# Patient Record
Sex: Male | Born: 1990 | Race: Black or African American | Hispanic: No | Marital: Single | Smoking: Never smoker
Health system: Southern US, Community
[De-identification: ages and names within clinical notes are randomized; demographics above are authoritative.]

## PROBLEM LIST (undated history)

## (undated) DIAGNOSIS — B20 Human immunodeficiency virus [HIV] disease: Secondary | ICD-10-CM

## (undated) DIAGNOSIS — Z21 Asymptomatic human immunodeficiency virus [HIV] infection status: Secondary | ICD-10-CM

## (undated) DIAGNOSIS — A546 Gonococcal infection of anus and rectum: Secondary | ICD-10-CM

## (undated) DIAGNOSIS — K5190 Ulcerative colitis, unspecified, without complications: Secondary | ICD-10-CM

## (undated) DIAGNOSIS — A630 Anogenital (venereal) warts: Secondary | ICD-10-CM

## (undated) HISTORY — PX: OTHER SURGICAL HISTORY: SHX169

---

## 2009-08-18 ENCOUNTER — Emergency Department (HOSPITAL_COMMUNITY): Admission: EM | Admit: 2009-08-18 | Discharge: 2009-08-19 | Payer: Self-pay | Admitting: Emergency Medicine

## 2010-04-21 LAB — URINALYSIS, ROUTINE W REFLEX MICROSCOPIC
Bilirubin Urine: NEGATIVE
Ketones, ur: NEGATIVE mg/dL
Nitrite: NEGATIVE
Specific Gravity, Urine: 1.021 (ref 1.005–1.030)
Urobilinogen, UA: 1 mg/dL (ref 0.0–1.0)

## 2010-04-21 LAB — HEMOCCULT GUIAC POC 1CARD (OFFICE): Fecal Occult Bld: POSITIVE

## 2013-11-02 ENCOUNTER — Encounter (HOSPITAL_BASED_OUTPATIENT_CLINIC_OR_DEPARTMENT_OTHER): Payer: Self-pay | Admitting: Emergency Medicine

## 2013-11-02 ENCOUNTER — Inpatient Hospital Stay (HOSPITAL_BASED_OUTPATIENT_CLINIC_OR_DEPARTMENT_OTHER)
Admission: EM | Admit: 2013-11-02 | Discharge: 2013-11-05 | DRG: 987 | Disposition: A | Discharge status: Home / Self Care | Payer: Self-pay | Attending: Internal Medicine | Admitting: Internal Medicine

## 2013-11-02 DIAGNOSIS — Z79899 Other long term (current) drug therapy: Secondary | ICD-10-CM

## 2013-11-02 DIAGNOSIS — R339 Retention of urine, unspecified: Secondary | ICD-10-CM

## 2013-11-02 DIAGNOSIS — B20 Human immunodeficiency virus [HIV] disease: Secondary | ICD-10-CM

## 2013-11-02 DIAGNOSIS — B370 Candidal stomatitis: Secondary | ICD-10-CM | POA: Diagnosis present

## 2013-11-02 DIAGNOSIS — K611 Rectal abscess: Principal | ICD-10-CM | POA: Diagnosis present

## 2013-11-02 HISTORY — DX: Human immunodeficiency virus (HIV) disease: B20

## 2013-11-02 HISTORY — DX: Gonococcal infection of anus and rectum: A54.6

## 2013-11-02 HISTORY — DX: Asymptomatic human immunodeficiency virus (hiv) infection status: Z21

## 2013-11-02 HISTORY — DX: Anogenital (venereal) warts: A63.0

## 2013-11-02 NOTE — ED Notes (Signed)
Pt c/o back pain was seen at [facility] today for same and unable to urinate since Sunday  Had foley placed  Did not receive any meds except muscle relaxant  Which is not controlling the pain

## 2013-11-02 NOTE — ED Notes (Signed)
Pt now states he was also told he had constipation

## 2013-11-02 NOTE — ED Notes (Signed)
Back pain x 3 days.  He was [facility] earlier today for back pain and unable to urinate. They inserted an indwelling foley and discharged him with a leg bag and told him he was constipated. They would not give him narcotics only muscle relaxer's and patient states they do not help his pain.

## 2013-11-03 ENCOUNTER — Encounter (HOSPITAL_COMMUNITY): Payer: Self-pay | Admitting: Anesthesiology

## 2013-11-03 ENCOUNTER — Encounter (HOSPITAL_BASED_OUTPATIENT_CLINIC_OR_DEPARTMENT_OTHER): Payer: Self-pay | Admitting: Emergency Medicine

## 2013-11-03 ENCOUNTER — Emergency Department (HOSPITAL_BASED_OUTPATIENT_CLINIC_OR_DEPARTMENT_OTHER): Payer: Self-pay

## 2013-11-03 ENCOUNTER — Encounter (HOSPITAL_COMMUNITY)
Admission: EM | Disposition: A | Discharge status: Home / Self Care | Payer: Self-pay | Source: Home / Self Care | Attending: Internal Medicine

## 2013-11-03 ENCOUNTER — Inpatient Hospital Stay (HOSPITAL_COMMUNITY): Payer: Self-pay | Admitting: Anesthesiology

## 2013-11-03 DIAGNOSIS — R339 Retention of urine, unspecified: Secondary | ICD-10-CM

## 2013-11-03 DIAGNOSIS — B20 Human immunodeficiency virus [HIV] disease: Secondary | ICD-10-CM

## 2013-11-03 DIAGNOSIS — K611 Rectal abscess: Secondary | ICD-10-CM | POA: Diagnosis present

## 2013-11-03 DIAGNOSIS — K612 Anorectal abscess: Secondary | ICD-10-CM

## 2013-11-03 HISTORY — PX: INCISION AND DRAINAGE PERIRECTAL ABSCESS: SHX1804

## 2013-11-03 LAB — GRAM STAIN

## 2013-11-03 LAB — CBC WITH DIFFERENTIAL/PLATELET
Basophils Absolute: 0 10*3/uL (ref 0.0–0.1)
Basophils Relative: 0 % (ref 0–1)
EOS PCT: 0 % (ref 0–5)
Eosinophils Absolute: 0 10*3/uL (ref 0.0–0.7)
HCT: 42.5 % (ref 39.0–52.0)
HEMOGLOBIN: 13.9 g/dL (ref 13.0–17.0)
LYMPHS ABS: 0.9 10*3/uL (ref 0.7–4.0)
LYMPHS PCT: 10 % — AB (ref 12–46)
MCH: 30 pg (ref 26.0–34.0)
MCHC: 32.7 g/dL (ref 30.0–36.0)
MCV: 91.8 fL (ref 78.0–100.0)
MONO ABS: 0.7 10*3/uL (ref 0.1–1.0)
MONOS PCT: 8 % (ref 3–12)
Neutro Abs: 7.4 10*3/uL (ref 1.7–7.7)
Neutrophils Relative %: 82 % — ABNORMAL HIGH (ref 43–77)
Platelets: 198 10*3/uL (ref 150–400)
RBC: 4.63 MIL/uL (ref 4.22–5.81)
RDW: 14.3 % (ref 11.5–15.5)
WBC: 9 10*3/uL (ref 4.0–10.5)

## 2013-11-03 LAB — CREATININE, SERUM
CREATININE: 0.82 mg/dL (ref 0.50–1.35)
GFR calc Af Amer: >90 mL/min (ref >90)
GFR calc non Af Amer: >90 mL/min (ref >90)

## 2013-11-03 LAB — URINALYSIS, ROUTINE W REFLEX MICROSCOPIC
BILIRUBIN URINE: NEGATIVE
GLUCOSE, UA: NEGATIVE mg/dL
HGB URINE DIPSTICK: NEGATIVE
KETONES UR: NEGATIVE mg/dL
Nitrite: NEGATIVE
PH: 6.5 (ref 5.0–8.0)
Protein, ur: NEGATIVE mg/dL
Specific Gravity, Urine: 1.022 (ref 1.005–1.030)
Urobilinogen, UA: 0.2 mg/dL (ref 0.0–1.0)

## 2013-11-03 LAB — SURGICAL PCR SCREEN
MRSA, PCR: NEGATIVE
STAPHYLOCOCCUS AUREUS: NEGATIVE

## 2013-11-03 LAB — BASIC METABOLIC PANEL
Anion gap: 12 (ref 5–15)
BUN: 9 mg/dL (ref 6–23)
CALCIUM: 9.6 mg/dL (ref 8.4–10.5)
CO2: 26 meq/L (ref 19–32)
CREATININE: 0.9 mg/dL (ref 0.50–1.35)
Chloride: 100 mEq/L (ref 96–112)
GFR calc Af Amer: >90 mL/min (ref >90)
GFR calc non Af Amer: >90 mL/min (ref >90)
GLUCOSE: 122 mg/dL — AB (ref 70–99)
Potassium: 4.5 mEq/L (ref 3.7–5.3)
Sodium: 138 mEq/L (ref 137–147)

## 2013-11-03 LAB — CBC
HCT: 42.7 % (ref 39.0–52.0)
HEMOGLOBIN: 13.8 g/dL (ref 13.0–17.0)
MCH: 30.2 pg (ref 26.0–34.0)
MCHC: 32.3 g/dL (ref 30.0–36.0)
MCV: 93.4 fL (ref 78.0–100.0)
Platelets: 180 10*3/uL (ref 150–400)
RBC: 4.57 MIL/uL (ref 4.22–5.81)
RDW: 15 % (ref 11.5–15.5)
WBC: 11.3 10*3/uL — ABNORMAL HIGH (ref 4.0–10.5)

## 2013-11-03 LAB — URINE MICROSCOPIC-ADD ON

## 2013-11-03 LAB — T-HELPER CELLS (CD4) COUNT (NOT AT ARMC)
CD4 % Helper T Cell: 24 % — ABNORMAL LOW (ref 33–55)
CD4 T CELL ABS: 200 /uL — AB (ref 400–2700)

## 2013-11-03 SURGERY — INCISION AND DRAINAGE, ABSCESS, PERIRECTAL
Anesthesia: General | Site: Rectum

## 2013-11-03 MED ORDER — HEPARIN SODIUM (PORCINE) 5000 UNIT/ML IJ SOLN
5000.0000 [IU] | Freq: Three times a day (TID) | INTRAMUSCULAR | Status: DC
  Filled 2013-11-03 (×2): qty 1

## 2013-11-03 MED ORDER — RITONAVIR 100 MG PO TABS
100.0000 mg | ORAL_TABLET | Freq: Every day | ORAL | Status: DC
  Administered 2013-11-03 – 2013-11-05 (×3): 100 mg via ORAL
  Filled 2013-11-03 (×4): qty 1

## 2013-11-03 MED ORDER — TENOFOVIR DISOPROXIL FUMARATE 300 MG PO TABS
300.0000 mg | ORAL_TABLET | Freq: Every day | ORAL | Status: DC
  Filled 2013-11-03: qty 1

## 2013-11-03 MED ORDER — PROPOFOL 10 MG/ML IV BOLUS
INTRAVENOUS | Status: AC
  Filled 2013-11-03: qty 20

## 2013-11-03 MED ORDER — FENTANYL CITRATE 0.05 MG/ML IJ SOLN: 25.0000 ug | INTRAMUSCULAR | Status: DC | PRN

## 2013-11-03 MED ORDER — SODIUM CHLORIDE 0.9 % IV SOLN
INTRAVENOUS | Status: DC
  Administered 2013-11-03: 01:00:00 via INTRAVENOUS

## 2013-11-03 MED ORDER — LIDOCAINE HCL (CARDIAC) 20 MG/ML IV SOLN
INTRAVENOUS | Status: AC
Start: 2013-11-03 — End: 2013-11-03
  Filled 2013-11-03: qty 5

## 2013-11-03 MED ORDER — METRONIDAZOLE IN NACL 5-0.79 MG/ML-% IV SOLN
500.0000 mg | Freq: Three times a day (TID) | INTRAVENOUS | Status: DC
  Administered 2013-11-03 – 2013-11-04 (×2): 500 mg via INTRAVENOUS
  Filled 2013-11-03 (×3): qty 100

## 2013-11-03 MED ORDER — ONDANSETRON HCL 4 MG/2ML IJ SOLN
INTRAMUSCULAR | Status: DC | PRN
  Administered 2013-11-03: 4 mg via INTRAVENOUS

## 2013-11-03 MED ORDER — ONDANSETRON HCL 4 MG PO TABS: 4.0000 mg | ORAL_TABLET | Freq: Four times a day (QID) | ORAL | Status: DC | PRN

## 2013-11-03 MED ORDER — CIPROFLOXACIN IN D5W 400 MG/200ML IV SOLN
400.0000 mg | Freq: Once | INTRAVENOUS | Status: AC
  Administered 2013-11-03: 400 mg via INTRAVENOUS
  Filled 2013-11-03: qty 200

## 2013-11-03 MED ORDER — MIDAZOLAM HCL 5 MG/5ML IJ SOLN
INTRAMUSCULAR | Status: DC | PRN
  Administered 2013-11-03: 2 mg via INTRAVENOUS

## 2013-11-03 MED ORDER — CHLORHEXIDINE GLUCONATE 0.12 % MT SOLN
15.0000 mL | Freq: Two times a day (BID) | OROMUCOSAL | Status: DC
  Administered 2013-11-03 – 2013-11-05 (×4): 15 mL via OROMUCOSAL
  Filled 2013-11-03 (×6): qty 15

## 2013-11-03 MED ORDER — MIDAZOLAM HCL 2 MG/2ML IJ SOLN
INTRAMUSCULAR | Status: AC
  Filled 2013-11-03: qty 2

## 2013-11-03 MED ORDER — LIDOCAINE HCL (CARDIAC) 20 MG/ML IV SOLN
INTRAVENOUS | Status: DC | PRN
  Administered 2013-11-03: 40 mg via INTRAVENOUS

## 2013-11-03 MED ORDER — PROMETHAZINE HCL 25 MG/ML IJ SOLN: 6.2500 mg | INTRAMUSCULAR | Status: DC | PRN

## 2013-11-03 MED ORDER — CETYLPYRIDINIUM CHLORIDE 0.05 % MT LIQD: 7.0000 mL | Freq: Two times a day (BID) | OROMUCOSAL | Status: DC

## 2013-11-03 MED ORDER — FENTANYL CITRATE 0.05 MG/ML IJ SOLN
100.0000 ug | Freq: Once | INTRAMUSCULAR | Status: AC
  Administered 2013-11-03: 100 ug via INTRAVENOUS
  Filled 2013-11-03: qty 2

## 2013-11-03 MED ORDER — MORPHINE SULFATE 2 MG/ML IJ SOLN
2.0000 mg | INTRAMUSCULAR | Status: DC | PRN
  Administered 2013-11-03: 2 mg via INTRAVENOUS
  Filled 2013-11-03: qty 1

## 2013-11-03 MED ORDER — FENTANYL CITRATE 0.05 MG/ML IJ SOLN
INTRAMUSCULAR | Status: DC | PRN
  Administered 2013-11-03: 50 ug via INTRAVENOUS

## 2013-11-03 MED ORDER — ACETAMINOPHEN 650 MG RE SUPP: 650.0000 mg | Freq: Four times a day (QID) | RECTAL | Status: DC | PRN

## 2013-11-03 MED ORDER — ZIDOVUDINE 100 MG PO CAPS
300.0000 mg | ORAL_CAPSULE | Freq: Two times a day (BID) | ORAL | Status: DC
  Administered 2013-11-03 – 2013-11-05 (×5): 300 mg via ORAL
  Filled 2013-11-03 (×8): qty 3

## 2013-11-03 MED ORDER — MORPHINE SULFATE 2 MG/ML IJ SOLN
2.0000 mg | Freq: Once | INTRAMUSCULAR | Status: AC
  Administered 2013-11-03: 2 mg via INTRAVENOUS
  Filled 2013-11-03: qty 1

## 2013-11-03 MED ORDER — IOHEXOL 300 MG/ML  SOLN
50.0000 mL | Freq: Once | INTRAMUSCULAR | Status: AC | PRN
  Administered 2013-11-03: 50 mL via ORAL

## 2013-11-03 MED ORDER — HEPARIN SODIUM (PORCINE) 5000 UNIT/ML IJ SOLN
5000.0000 [IU] | Freq: Three times a day (TID) | INTRAMUSCULAR | Status: DC
  Filled 2013-11-03: qty 1

## 2013-11-03 MED ORDER — ONDANSETRON HCL 4 MG/2ML IJ SOLN: 4.0000 mg | Freq: Four times a day (QID) | INTRAMUSCULAR | Status: DC | PRN

## 2013-11-03 MED ORDER — ACETAMINOPHEN 325 MG PO TABS: 650.0000 mg | ORAL_TABLET | Freq: Four times a day (QID) | ORAL | Status: DC | PRN

## 2013-11-03 MED ORDER — IOHEXOL 300 MG/ML  SOLN
100.0000 mL | Freq: Once | INTRAMUSCULAR | Status: AC | PRN
  Administered 2013-11-03: 100 mL via INTRAVENOUS

## 2013-11-03 MED ORDER — DOCUSATE SODIUM 100 MG PO CAPS
100.0000 mg | ORAL_CAPSULE | Freq: Two times a day (BID) | ORAL | Status: DC
  Administered 2013-11-03 – 2013-11-04 (×3): 100 mg via ORAL
  Filled 2013-11-03 (×5): qty 1

## 2013-11-03 MED ORDER — CIPROFLOXACIN IN D5W 400 MG/200ML IV SOLN
400.0000 mg | Freq: Two times a day (BID) | INTRAVENOUS | Status: DC
  Administered 2013-11-03: 400 mg via INTRAVENOUS
  Filled 2013-11-03 (×2): qty 200

## 2013-11-03 MED ORDER — HEPARIN SODIUM (PORCINE) 5000 UNIT/ML IJ SOLN
5000.0000 [IU] | Freq: Three times a day (TID) | INTRAMUSCULAR | Status: DC
  Administered 2013-11-04 – 2013-11-05 (×2): 5000 [IU] via SUBCUTANEOUS
  Filled 2013-11-03 (×5): qty 1

## 2013-11-03 MED ORDER — METRONIDAZOLE IN NACL 5-0.79 MG/ML-% IV SOLN
500.0000 mg | Freq: Once | INTRAVENOUS | Status: AC
  Administered 2013-11-03: 500 mg via INTRAVENOUS
  Filled 2013-11-03: qty 100

## 2013-11-03 MED ORDER — SODIUM CHLORIDE 0.9 % IV SOLN
INTRAVENOUS | Status: DC
  Administered 2013-11-03 – 2013-11-04 (×2): via INTRAVENOUS

## 2013-11-03 MED ORDER — HYDROMORPHONE HCL 1 MG/ML IJ SOLN
1.0000 mg | Freq: Once | INTRAMUSCULAR | Status: AC
  Administered 2013-11-03: 1 mg via INTRAVENOUS
  Filled 2013-11-03: qty 1

## 2013-11-03 MED ORDER — ONDANSETRON HCL 4 MG/2ML IJ SOLN
INTRAMUSCULAR | Status: AC
  Filled 2013-11-03: qty 2

## 2013-11-03 MED ORDER — TENOFOVIR DISOPROXIL FUMARATE 300 MG PO TABS
300.0000 mg | ORAL_TABLET | Freq: Every day | ORAL | Status: DC
  Administered 2013-11-03 – 2013-11-05 (×3): 300 mg via ORAL
  Filled 2013-11-03 (×3): qty 1

## 2013-11-03 MED ORDER — POLYETHYLENE GLYCOL 3350 17 G PO PACK
17.0000 g | PACK | Freq: Every day | ORAL | Status: DC
  Administered 2013-11-03 – 2013-11-04 (×2): 17 g via ORAL
  Filled 2013-11-03 (×3): qty 1

## 2013-11-03 MED ORDER — FENTANYL CITRATE 0.05 MG/ML IJ SOLN
INTRAMUSCULAR | Status: AC
  Filled 2013-11-03: qty 5

## 2013-11-03 MED ORDER — FENTANYL CITRATE 0.05 MG/ML IJ SOLN
100.0000 ug | Freq: Once | INTRAMUSCULAR | Status: AC
Start: 2013-11-03 — End: 2013-11-03
  Administered 2013-11-03: 100 ug via INTRAVENOUS
  Filled 2013-11-03: qty 2

## 2013-11-03 MED ORDER — MORPHINE SULFATE 2 MG/ML IJ SOLN: 1.0000 mg | INTRAMUSCULAR | Status: DC | PRN

## 2013-11-03 MED ORDER — DARUNAVIR ETHANOLATE 800 MG PO TABS
800.0000 mg | ORAL_TABLET | Freq: Every day | ORAL | Status: DC
  Administered 2013-11-04 – 2013-11-05 (×2): 800 mg via ORAL
  Filled 2013-11-03 (×5): qty 1

## 2013-11-03 MED ORDER — PROPOFOL 10 MG/ML IV BOLUS
INTRAVENOUS | Status: DC | PRN
  Administered 2013-11-03: 150 mg via INTRAVENOUS

## 2013-11-03 MED ORDER — TAMSULOSIN HCL 0.4 MG PO CAPS
0.4000 mg | ORAL_CAPSULE | Freq: Every day | ORAL | Status: DC
  Administered 2013-11-03 – 2013-11-05 (×3): 0.4 mg via ORAL
  Filled 2013-11-03 (×3): qty 1

## 2013-11-03 MED ORDER — OXYCODONE HCL 5 MG PO TABS: 5.0000 mg | ORAL_TABLET | ORAL | Status: DC | PRN

## 2013-11-03 MED ORDER — SODIUM CHLORIDE 0.9 % IJ SOLN
INTRAMUSCULAR | Status: AC
  Filled 2013-11-03: qty 50

## 2013-11-03 SURGICAL SUPPLY — 16 items
BNDG GAUZE ELAST 4 BULKY (GAUZE/BANDAGES/DRESSINGS) ×2 IMPLANT
DRAPE UTILITY XL STRL (DRAPES) ×2 IMPLANT
GAUZE IODOFORM PACK 1/2 7832 (GAUZE/BANDAGES/DRESSINGS) ×2 IMPLANT
GLOVE BIOGEL M STRL SZ7.5 (GLOVE) ×2 IMPLANT
GLOVE BIOGEL PI IND STRL 7.0 (GLOVE) ×3 IMPLANT
GLOVE BIOGEL PI INDICATOR 7.0 (GLOVE) ×3
GLOVE INDICATOR 8.0 STRL GRN (GLOVE) ×2 IMPLANT
GOWN STRL REUS W/TWL LRG LVL3 (GOWN DISPOSABLE) ×2 IMPLANT
GOWN STRL REUS W/TWL XL LVL3 (GOWN DISPOSABLE) ×4 IMPLANT
LEGGING LITHOTOMY PAIR STRL (DRAPES) ×2 IMPLANT
LUBRICANT JELLY K Y 4OZ (MISCELLANEOUS) ×2 IMPLANT
PACK GENERAL/GYN (CUSTOM PROCEDURE TRAY) ×2 IMPLANT
SPONGE SURGIFOAM ABS GEL 12-7 (HEMOSTASIS) ×2 IMPLANT
TOWEL NATURAL BULK DISP NONST (DISPOSABLE) ×2 IMPLANT
TOWEL OR 17X26 10 PK STRL BLUE (TOWEL DISPOSABLE) ×2 IMPLANT
TUBE ANAEROBIC SPECIMEN COL (MISCELLANEOUS) ×4 IMPLANT

## 2013-11-03 NOTE — ED Notes (Signed)
Per [facility], patient given a total of 400mcg Fentanyl (in divided doses) prior to leaving [facility]. Patient is complaining of pain on arrival, rested comfortably during transport. Patient currently laying on his right side. Patient is HIV+.

## 2013-11-03 NOTE — H&P (Addendum)
Patient Demographics  Corey Cross, is a 23 y.o. male  MRN: [id number]   DOB - [birth year]  Admit Date - 11/02/2013  Outpatient Primary MD for the patient is No PCP Per Patient   With History of -  Past Medical History  Diagnosis Date  . HIV positive   . AIDS   . Anal condylomata   . Rectal gonorrhea       History reviewed. No pertinent past surgical history.  in for   Chief Complaint  Patient presents with  . Rectal Pain      HPI  Corey Cross  is a 23 y.o. male, with known history of HIV, having his followup at [facility], patient reports he has been compliant with his HIV medication,(even though he mentioned earlier to ED physician he was not taking any HIV medication) and reports regular followup with his infectious disease at [facility], who presents with complaints of rectal pain started 5 days ago, patient was at [facility] Monday morning, where he was diagnosed with it in retention, as indwelling Foley catheter inserted under and recommendation to followup with urology as an outpatient, patient reports rectal pain for last 4 days, CT abdomen and pelvis done at [facility] to [facility] ED showing rectal abscess, initially patient was transferred [facility] emergency department for surgical evaluation, but given his history of hypertension, and HIV, hospitalist requested to admit the patient, patient denies any fever or chills, no nausea no vomiting, no rectal bleed, no melena, no discharge. Patient reports his physician stopped his Bactrim as his numbers were improving, and he did not require Bactrim anymore.  Review of Systems    In addition to the HPI above,  No Fever-chills, No Headache, No changes with Vision or hearing, No problems swallowing food or Liquids, No Chest pain, Cough or Shortness of Breath, No Abdominal pain, No Nausea or Vommitting, Bowel movements are regular, complaints of rectal pain No Blood  in stool or Urine, No dysuria, has urinary retention. No new skin rashes or bruises, No new joints pains-aches,  No new weakness, tingling, numbness in any extremity, No recent weight gain or loss, No polyuria, polydypsia or polyphagia, No significant Mental Stressors.  A full 10 point Review of Systems was done, except as stated above, all other Review of Systems were negative.   Social History History  Substance Use Topics  . Smoking status: Never Smoker   . Smokeless tobacco: Not on file  . Alcohol Use: Yes    Family History No family history on file.   Prior to Admission medications   Medication Sig Start Date End Date Taking? Authorizing Provider  Darunavir Ethanolate (PREZISTA) 800 MG tablet Take 800 mg by mouth every morning. 09/03/13  Yes Historical Provider, MD  ibuprofen (ADVIL,MOTRIN) 200 MG tablet Take 200 mg by mouth every 6 (six) hours as needed for moderate pain.   Yes Historical Provider, MD  ritonavir (NORVIR) 100 MG TABS tablet Take 100 mg by mouth every morning. 09/03/13  Yes Historical Provider, MD  tenofovir (VIREAD) 300 MG tablet Take 300 mg by mouth daily. 09/03/13  Yes Historical Provider, MD  zidovudine (RETROVIR) 300 MG tablet Take 300 mg by mouth 2 (two) times daily. 09/03/13  Yes Historical Provider, MD    No Known Allergies  Physical Exam  Vitals  Blood pressure 111/72, pulse 91, temperature 98.9 F (37.2 C), temperature source Oral, resp. rate 18, height 5\' 10"  (1.778  m), weight 63.504 kg (140 lb), SpO2 99.00%.   1. General well-nourished male lying in bed in NAD,    2. Normal affect and insight, Not Suicidal or Homicidal, Awake Alert, Oriented X 3.  3. No F.N deficits, ALL C.Nerves Intact, Strength 5/5 all 4 extremities, Sensation intact all 4 extremities, Plantars down going.  4. Ears and Eyes appear Normal, Conjunctivae clear, PERRLA. Moist Oral Mucosa. With layer of thrush on the tongue, but no thrush on any other oral mucosa.  5.  Supple Neck, No JVD, No cervical lymphadenopathy appriciated, No Carotid Bruits.  6. Symmetrical Chest wall movement, Good air movement bilaterally, CTAB.  7. RRR, No Gallops, Rubs or Murmurs, No Parasternal Heave.  8. Positive Bowel Sounds, Abdomen Soft, No tenderness, No organomegaly appriciated,No rebound -guarding or rigidity. Rectal exam showing normal sphincter tone, no discharge no bleed.  9.  No Cyanosis, Normal Skin Turgor, No Skin Rash or Bruise.  10. Good muscle tone,  joints appear normal , no effusions, Normal ROM.  11. No Palpable Lymph Nodes in Neck or Axillae    Data Review  CBC  Recent Labs Lab 11/03/13 0130  WBC 9.0  HGB 13.9  HCT 42.5  PLT 198  MCV 91.8  MCH 30.0  MCHC 32.7  RDW 14.3  LYMPHSABS 0.9  MONOABS 0.7  EOSABS 0.0  BASOSABS 0.0   ------------------------------------------------------------------------------------------------------------------  Chemistries   Recent Labs Lab 11/03/13 0130  NA 138  K 4.5  CL 100  CO2 26  GLUCOSE 122*  BUN 9  CREATININE 0.90  CALCIUM 9.6   ------------------------------------------------------------------------------------------------------------------ estimated creatinine clearance is 112.7 ml/min (by C-G formula based on Cr of 0.9). ------------------------------------------------------------------------------------------------------------------ No results found for this basename: TSH, T4TOTAL, FREET3, T3FREE, THYROIDAB,  in the last 72 hours   Coagulation profile No results found for this basename: INR, PROTIME,  in the last 168 hours ------------------------------------------------------------------------------------------------------------------- No results found for this basename: DDIMER,  in the last 72 hours -------------------------------------------------------------------------------------------------------------------  Cardiac Enzymes No results found for this basename: CK, CKMB,  TROPONINI, MYOGLOBIN,  in the last 168 hours ------------------------------------------------------------------------------------------------------------------ No components found with this basename: POCBNP,    ---------------------------------------------------------------------------------------------------------------  Urinalysis    Component Value Date/Time   COLORURINE YELLOW 08/19/2009 0115   APPEARANCEUR CLEAR 08/19/2009 0115   LABSPEC 1.021 08/19/2009 0115   PHURINE 6.0 08/19/2009 0115   GLUCOSEU NEGATIVE 08/19/2009 0115   HGBUR NEGATIVE 08/19/2009 0115   BILIRUBINUR NEGATIVE 08/19/2009 0115   KETONESUR NEGATIVE 08/19/2009 0115   PROTEINUR NEGATIVE 08/19/2009 0115   UROBILINOGEN 1.0 08/19/2009 0115   NITRITE NEGATIVE 08/19/2009 0115   LEUKOCYTESUR NEGATIVE MICROSCOPIC NOT DONE ON URINES WITH NEGATIVE PROTEIN, BLOOD, LEUKOCYTES, NITRITE, OR GLUCOSE <1000 mg/dL. 08/19/2009 0115    ----------------------------------------------------------------------------------------------------------------  Imaging results:   Ct Abdomen Pelvis W Contrast  11/03/2013   CLINICAL DATA:  Back pain and rectal pain. Cory abscess. History of HIV.  EXAM: CT ABDOMEN AND PELVIS WITH CONTRAST  TECHNIQUE: Multidetector CT imaging of the abdomen and pelvis was performed using the standard protocol following bolus administration of intravenous contrast.  CONTRAST:  50mL OMNIPAQUE IOHEXOL 300 MG/ML SOLN, 100mL OMNIPAQUE IOHEXOL 300 MG/ML SOLN  COMPARISON:  None.  FINDINGS: Lung bases are clear.  The liver, spleen, gallbladder, pancreas, adrenal glands, kidneys, abdominal aorta, inferior vena cava, and retroperitoneal lymph nodes are unremarkable. Stomach and small bowel are decompressed. Small accessory spleen. Gas and stool filled colon. Mild diffuse colonic distention suggesting ileus. No free air or free fluid in the abdomen.  Pelvis: Foley catheter decompresses the bladder. Prostate gland is not enlarged. Appendix is  normal. No evidence of diverticulitis. No free or loculated pelvic fluid collections. There is a right perirectal fluid collection with peripheral enhancement consistent with abscess measuring 2.7 x 3.7 cm. This arises at the level of the distal rectum and appears to be above the levator muscles.  IMPRESSION: Right perirectal abscess measuring 2.7 x 3.7 cm.   Electronically Signed   By: Burman Nieves M.D.   On: 11/03/2013 04:10        Assessment & Plan  Active Problems:   Rectal abscess   Urinary retention   HIV disease    1. rectal abscess:  Surgery were consulted by ED, patient will be taking today to OR for drainage of abscess, he'll be continued on IV Cipro and IV Flagyl meanwhile, will request to add gonococcal culture to abscess given his known history of rectal gonococcal infection.  2. Urinary retention: Will start patient on Flomax, will continue with Foley catheter, can follow with urology as an outpatient.  3. History of HIV: Most recent CD4 count is 200 at [facility] in July, there is a questionable history of compliance with medication (but patient reports he has been taking his medications regularly) patient will be resumed back on his regimen, we'll continue him on Bactrim. Check CD4 count.   DVT Prophylaxis Heparin -   SCDs  AM Labs Ordered, also please review Full Orders  Family Communication: Admission, patients condition and plan of care including tests being ordered have been discussed with the patient  who indicate understanding and agree with the plan and Code Status.  Patient does not want his HIV status to be discussed in front of any other family members.  Code Status Full code  Likely DC to  home  Condition GUARDED    Time spent in minutes : 55 minutes    Takuya Lariccia M.D on 11/03/2013 at 8:49 AM  Between 7am to 7pm - Pager - [phone]  After 7pm go to [email or web address] - password [email or web address]  And look for the night coverage person  covering me after hours  [facility] Office  [phone]   **Disclaimer: This note may have been dictated with voice recognition software. Similar sounding words can inadvertently be transcribed and this note may contain transcription errors which may not have been corrected upon publication of note.**

## 2013-11-03 NOTE — Op Note (Signed)
11/02/2013 - 11/03/2013  2:25 PM  PATIENT:  Corey Cross Lanza  23 y.o. male  PRE-OPERATIVE DIAGNOSIS:  Right perirectal abcess  POST-OPERATIVE DIAGNOSIS:  same  PROCEDURE:  Procedure(s): EXAM UNDER ANESTHESIA & INICISION & DEBRIDEMENT OF RIGHT PERIRECTAL ABSCESS with scalpel (2cm x 2cm)  SURGEON:  Surgeon(s): Atilano InaEric M Gabbrielle Mcnicholas, MD  ASSISTANTS: none   ANESTHESIA:   general  DRAINS: Urinary Catheter (Foley)   LOCAL MEDICATIONS USED:  NONE  SPECIMEN:  Source of Specimen:  perirectal abscess fluid  DISPOSITION OF SPECIMEN:  micro  COUNTS:  YES  INDICATION FOR PROCEDURE: 23 year old [place] male with HIV who presented with urinary retention on Monday requiring a Foley insertion at outside hospital. The patient was discharged from the emergency room and presented to one of our emergency room for evaluation. He is found to have a perirectal abscess on CT scan and we were consulted for management. The abscess measures 3 cm x 4 cm at and above the level of levators. I recommended exam under anesthesia with internal drainage. There is no external component. We discussed extensively the risks and benefits of surgery.  PROCEDURE: After obtaining informed consent the patient was [facility] and placed upon the operating room table. General endotracheal anesthesia was established. The patient was placed in lithotomy with the appropriate padding. His previously placed indwelling Foley catheter was draped off the field. His buttocks and perineum and anus were prepped and draped in the usual standard surgical fashion with Betadine. There is no external signs of disease. Digital rectal exam was performed after a surgical timeout was performed. The patient was on therapeutic antibiotics. There is a large fluctuant bulge on palpation in the right posterior lateral position. Anoscopy was performed which demonstrated a large bulge about 2-1/2 cm from anal verge in  the right posterior lateral position. On insertion of the anoscope it started to spontaneously drain through a tiny pin hole in the mucosa. Using a 15 blade I enlarged the opening to about the size of a dime. I made an elliptical incision. There is frank purulent drainage. Cultures were obtained. I probed the cavity with a Tresa EndoKelly. It appeared there is no undrained fluid collection. Hemostasis was achieved along the mucosal edges with electrocautery.  quarter-inch iodoform gauze was placed into the abscess cavity with the tail hanging out of the rectum. A piece of Gelfoam was placed in the rectal vault. Mesh underwear and fluffs were placed next. The patient tolerated the procedure well. All needle, instrument, and sponge counts were correct x2 there were no immediate complications. The patient was extubated and taken to the recovery room in stable condition  PLAN OF CARE: pacu then back to floor  PATIENT DISPOSITION:  PACU - hemodynamically stable.   Delay start of Pharmacological VTE agent (>24hrs) due to surgical blood loss or risk of bleeding:  no  Mary SellaEric M. Andrey CampanileWilson, MD, FACS General, Bariatric, & Minimally Invasive Surgery [facility], [place]

## 2013-11-03 NOTE — Progress Notes (Signed)
UR completed 

## 2013-11-03 NOTE — Anesthesia Postprocedure Evaluation (Signed)
  Anesthesia Post-op Note  Patient: Corey Cross  Procedure(s) Performed: Procedure(s) (LRB): IRRIGATION AND DEBRIDEMENT PERIRECTAL ABSCESS (N/A)  Patient Location: PACU  Anesthesia Type: General  Level of Consciousness: awake and alert   Airway and Oxygen Therapy: Patient Spontanous Breathing  Post-op Pain: mild  Post-op Assessment: Post-op Vital signs reviewed, Patient's Cardiovascular Status Stable, Respiratory Function Stable, Patent Airway and No signs of Nausea or vomiting  Last Vitals:  Filed Vitals:   11/03/13 1029  BP: 116/79  Pulse: 81  Temp: 36.8 C  Resp: 20    Post-op Vital Signs: stable   Complications: No apparent anesthesia complications

## 2013-11-03 NOTE — ED Provider Notes (Signed)
  Physical Exam  BP 125/90  Pulse 88  Temp(Src) 98.9 F (37.2 C) (Oral)  Resp 19  Ht 5\' 10"  (1.778 m)  Wt 140 lb (63.504 kg)  BMI 20.09 kg/m2  SpO2 96%  Physical Exam  ED Course  Procedures  MDM Patient has been seen by general surgery and taken to the  OR today. Dr. Andrey CampanileWilson requested admission to internal medicine. I discussed with hospitalist, who will admit patient.       Juliet RudeNathan R. Rubin PayorPickering, MD 11/03/13 [phone]

## 2013-11-03 NOTE — ED Notes (Signed)
Pt presents with 74F indwelling foley catheter  With 10mL balloon that he reports was placed yesterday at [facility] for acute urinary retention. New leg bag placed for urine collection for urinalysis.

## 2013-11-03 NOTE — ED Notes (Signed)
Attempted to call report. Was told be Diplomatic Services operational officersecretary they are in bed meeting to call back in 15-20 min. Will try back.

## 2013-11-03 NOTE — ED Provider Notes (Addendum)
CSN: [id number]     Arrival date & time 11/02/13  2240 History   First MD Initiated Contact with Patient 11/03/13 0105     Chief Complaint  Patient presents with  . Rectal Pain      (Consider location/radiation/quality/duration/timing/severity/associated sxs/prior Treatment) HPI This is a 23 year old male AIDS patient (CD4 count 200 in July, 2015) not currently on antiretrovirals. He is here with several days of rectal pain and inability to urinate. He was seen yesterday at [facility] and was diagnosed with urinary retention ( ). A Foley catheter was placed and he was discharged home. He had plain films of the abdomen taken which were unremarkable. He states the pain in his rectum is severe and worse with palpation or movement. He is unable to move his bowels. He has not had a fever. He states he's had similar symptoms with prostatitis in the past.   Past Medical History  Diagnosis Date  . HIV positive   . AIDS   . Anal condylomata   . Rectal gonorrhea    History reviewed. No pertinent past surgical history. No family history on file. History  Substance Use Topics  . Smoking status: Never Smoker   . Smokeless tobacco: Not on file  . Alcohol Use: Yes    Review of Systems  All other systems reviewed and are negative.   Allergies  Review of patient's allergies indicates no known allergies.  Home Medications   Prior to Admission medications   Not on File   BP 114/71  Pulse 87  Temp(Src) 98.2 F (36.8 C) (Oral)  Resp 18  Ht 5\' 10"  (1.778 m)  Wt 140 lb (63.504 kg)  BMI 20.09 kg/m2  SpO2 99%  Physical Exam General: Well-developed, well-nourished male in no acute distress; appearance consistent with age of record HENT: normocephalic; atraumatic Eyes: pupils equal, round and reactive to light; extraocular muscles intact Neck: supple Heart: regular rate and rhythm Lungs: clear to auscultation bilaterally Abdomen: soft; nondistended; nontender; no  masses or hepatosplenomegaly; bowel sounds present Rectal: Normal sphincter tone; no hemorrhoids noted; tender, fluctuant mass in rectal vault palpated Extremities: No deformity; full range of motion; pulses normal Neurologic: Awake, alert and oriented; motor function intact in all extremities and symmetric; no facial droop Skin: Warm and dry  Psychiatric: Flat affect    ED Course  Procedures (including critical care time)  MDM  Nursing notes and vitals signs, including pulse oximetry, reviewed.  Summary of this visit's results, reviewed by myself:  Labs:  Results for orders placed during the hospital encounter of 11/02/13 (from the past 24 hour(s))  CBC WITH DIFFERENTIAL     Status: Abnormal   Collection Time    11/03/13  1:30 AM      Result Value Ref Range   WBC 9.0  4.0 - 10.5 K/uL   RBC 4.63  4.22 - 5.81 MIL/uL   Hemoglobin 13.9  13.0 - 17.0 g/dL   HCT 40.9  81.1 - 91.4 %   MCV 91.8  78.0 - 100.0 fL   MCH 30.0  26.0 - 34.0 pg   MCHC 32.7  30.0 - 36.0 g/dL   RDW 78.2  95.6 - 21.3 %   Platelets 198  150 - 400 K/uL   Neutrophils Relative % 82 (*) 43 - 77 %   Neutro Abs 7.4  1.7 - 7.7 K/uL   Lymphocytes Relative 10 (*) 12 - 46 %   Lymphs Abs 0.9  0.7 - 4.0 K/uL  Monocytes Relative 8  3 - 12 %   Monocytes Absolute 0.7  0.1 - 1.0 K/uL   Eosinophils Relative 0  0 - 5 %   Eosinophils Absolute 0.0  0.0 - 0.7 K/uL   Basophils Relative 0  0 - 1 %   Basophils Absolute 0.0  0.0 - 0.1 K/uL  BASIC METABOLIC PANEL     Status: Abnormal   Collection Time    11/03/13  1:30 AM      Result Value Ref Range   Sodium 138  137 - 147 mEq/L   Potassium 4.5  3.7 - 5.3 mEq/L   Chloride 100  96 - 112 mEq/L   CO2 26  19 - 32 mEq/L   Glucose, Bld 122 (*) 70 - 99 mg/dL   BUN 9  6 - 23 mg/dL   Creatinine, Ser 1.610.90  0.50 - 1.35 mg/dL   Calcium 9.6  8.4 - 09.610.5 mg/dL   GFR calc non Af Amer >90  >90 mL/min   GFR calc Af Amer >90  >90 mL/min   Anion gap 12  5 - 15    Imaging Studies: Ct  Abdomen Pelvis W Contrast  11/03/2013   CLINICAL DATA:  Back pain and rectal pain. Cory abscess. History of HIV.  EXAM: CT ABDOMEN AND PELVIS WITH CONTRAST  TECHNIQUE: Multidetector CT imaging of the abdomen and pelvis was performed using the standard protocol following bolus administration of intravenous contrast.  CONTRAST:  50mL OMNIPAQUE IOHEXOL 300 MG/ML SOLN, 100mL OMNIPAQUE IOHEXOL 300 MG/ML SOLN  COMPARISON:  None.  FINDINGS: Lung bases are clear.  The liver, spleen, gallbladder, pancreas, adrenal glands, kidneys, abdominal aorta, inferior vena cava, and retroperitoneal lymph nodes are unremarkable. Stomach and small bowel are decompressed. Small accessory spleen. Gas and stool filled colon. Mild diffuse colonic distention suggesting ileus. No free air or free fluid in the abdomen.  Pelvis: Foley catheter decompresses the bladder. Prostate gland is not enlarged. Appendix is normal. No evidence of diverticulitis. No free or loculated pelvic fluid collections. There is a right perirectal fluid collection with peripheral enhancement consistent with abscess measuring 2.7 x 3.7 cm. This arises at the level of the distal rectum and appears to be above the levator muscles.  IMPRESSION: Right perirectal abscess measuring 2.7 x 3.7 cm.   Electronically Signed   By: Burman NievesWilliam  Stevens M.D.   On: 11/03/2013 04:10   5:09 AM Wenda LowMatt Martin, MD of General Surgery accepts [facility]fers [facility].    Hanley SeamenJohn L Ailani Governale, MD 11/03/13 0510  Hanley SeamenJohn L Bram Hottel, MD 11/03/13 [phone]

## 2013-11-03 NOTE — Anesthesia Preprocedure Evaluation (Addendum)
Anesthesia Evaluation  Patient identified by MRN, date of birth, ID band Patient awake    Reviewed: Allergy & Precautions, H&P , NPO status , Patient's Chart, lab work & pertinent test results  History of Anesthesia Complications Negative for: history of anesthetic complications  Airway Mallampati: II TM Distance: >3 FB Neck ROM: Full    Dental no notable dental hx.    Pulmonary neg pulmonary ROS,  breath sounds clear to auscultation  Pulmonary exam normal       Cardiovascular negative cardio ROS  Rhythm:Regular Rate:Normal     Neuro/Psych negative neurological ROS  negative psych ROS   GI/Hepatic negative GI ROS, Neg liver ROS,   Endo/Other  negative endocrine ROS  Renal/GU negative Renal ROS  negative genitourinary   Musculoskeletal negative musculoskeletal ROS (+)   Abdominal   Peds negative pediatric ROS (+)  Hematology negative hematology ROS (+) HIV,   Anesthesia Other Findings   Reproductive/Obstetrics negative OB ROS                           Anesthesia Physical Anesthesia Plan  ASA: III  Anesthesia Plan: General   Post-op Pain Management:    Induction: Intravenous  Airway Management Planned: LMA  Additional Equipment:   Intra-op Plan:   Post-operative Plan: Extubation in OR  Informed Consent: I have reviewed the patients History and Physical, chart, labs and discussed the procedure including the risks, benefits and alternatives for the proposed anesthesia with the patient or authorized representative who has indicated his/her understanding and acceptance.   Dental advisory given  Plan Discussed with: CRNA  Anesthesia Plan Comments:        Anesthesia Quick Evaluation

## 2013-11-03 NOTE — ED Notes (Signed)
Flagyl and cipro administered together. Compatibility checked via [employer]rom[employer].

## 2013-11-03 NOTE — ED Notes (Signed)
Hospitalist at bedside 

## 2013-11-03 NOTE — ED Notes (Signed)
[facility] She reports she is giving meds and will call me back.

## 2013-11-03 NOTE — Transfer of Care (Signed)
Immediate Anesthesia Transfer of Care Note  Patient: Corey Cross  Procedure(s) Performed: Procedure(s): IRRIGATION AND DEBRIDEMENT PERIRECTAL ABSCESS (N/A)  Patient Location: PACU  Anesthesia Type:General  Level of Consciousness: awake, alert  and oriented  Airway & Oxygen Therapy: Patient Spontanous Breathing and Patient connected to face mask oxygen  Post-op Assessment: Report given to PACU RN and Post -op Vital signs reviewed and stable  Post vital signs: Reviewed and stable  Complications: No apparent anesthesia complications

## 2013-11-03 NOTE — ED Notes (Signed)
Surgeon has been paged, called back advising that a OR PA-C will come assess the pt in 30-40 mins time. Pt updated.

## 2013-11-03 NOTE — Progress Notes (Signed)
Patient arrived to unit. Alert and oriented. Pain is 3/10. In bed resting quietly.

## 2013-11-03 NOTE — ED Notes (Signed)
Surgeon at bedside.  

## 2013-11-03 NOTE — ED Notes (Signed)
Patient transported to CT 

## 2013-11-03 NOTE — Consult Note (Signed)
Reason for Consult:rectal abscess Referring Physician: Dr Newt Minion Corey Cross is an 23 y.o. male.  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 The patient states he was in his usual state of health until this past weekend. On Saturday he started to have some difficulty urinating. On Monday he was not able to void. He went to [facility] and was evaluated in the emergency department. He was found to be in urinary retention and a Foley catheter was placed and he was discharged with outpatient followup. He started having some subjective fevers and poor appetite over the weekend as well. He presented back to the emergency department [facility]. He was complaining of throbbing in his rectum. A CT scan revealed a decent size right-sided perirectal abscess. He states his last bowel movement was Friday. He normally goes daily. He denies any melena or hematochezia. He denies any dysuria. He denies any pneumaturia. 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 He states he had anal warts surgery at [facility] in December of this past year. He gets his HIV care at [facility] as well. He states he is compliant with his HIV medication. He denies any unplanned weight loss. He denies any night sweats. He denies any shorts of breath or productive cough. He denies any severe pain with defecation  Past Medical History  Diagnosis Date  . HIV positive   . AIDS   . Anal condylomata   . Rectal gonorrhea     Past Surgical History  Procedure Laterality Date  . Warts removed      History reviewed. No pertinent family history.  Social History:  reports that he has never smoked. He has never used smokeless tobacco. He reports that he drinks alcohol. He reports that he does not use illicit drugs.  Allergies: No Known Allergies  Medications: I have reviewed the  patient's current medications.  Results for orders placed during the hospital encounter of 11/02/13 (from the past 48 hour(s))  CBC WITH DIFFERENTIAL     Status: Abnormal   Collection Time    11/03/13  1:30 AM      Result Value Ref Range   WBC 9.0  4.0 - 10.5 K/uL   RBC 4.63  4.22 - 5.81 MIL/uL   Hemoglobin 13.9  13.0 - 17.0 g/dL   HCT 42.5  39.0 - 52.0 %   MCV 91.8  78.0 - 100.0 fL   MCH 30.0  26.0 - 34.0 pg   MCHC 32.7  30.0 - 36.0 g/dL   RDW 14.3  11.5 - 15.5 %   Platelets 198  150 - 400 K/uL   Neutrophils Relative % 82 (*) 43 - 77 %   Neutro Abs 7.4  1.7 - 7.7 K/uL   Lymphocytes Relative 10 (*) 12 - 46 %   Lymphs Abs 0.9  0.7 - 4.0 K/uL   Monocytes Relative 8  3 - 12 %   Monocytes Absolute 0.7  0.1 - 1.0 K/uL   Eosinophils Relative 0  0 - 5 %   Eosinophils Absolute 0.0  0.0 - 0.7 K/uL   Basophils Relative 0  0 - 1 %   Basophils Absolute 0.0  0.0 - 0.1 K/uL  BASIC METABOLIC PANEL     Status: Abnormal   Collection Time    11/03/13  1:30 AM  Result Value Ref Range   Sodium 138  137 - 147 mEq/L   Potassium 4.5  3.7 - 5.3 mEq/L   Chloride 100  96 - 112 mEq/L   CO2 26  19 - 32 mEq/L   Glucose, Bld 122 (*) 70 - 99 mg/dL   BUN 9  6 - 23 mg/dL   Creatinine, Ser 0.90  0.50 - 1.35 mg/dL   Calcium 9.6  8.4 - 10.5 mg/dL   GFR calc non Af Amer >90  >90 mL/min   GFR calc Af Amer >90  >90 mL/min   Comment: (NOTE)     The eGFR has been calculated using the CKD EPI equation.     This calculation has not been validated in all clinical situations.     eGFR's persistently <90 mL/min signify possible Chronic Kidney     Disease.   Anion gap 12  5 - 15    Ct Abdomen Pelvis W Contrast  11/03/2013   CLINICAL DATA:  Back pain and rectal pain. Cory abscess. History of HIV.  EXAM: CT ABDOMEN AND PELVIS WITH CONTRAST  TECHNIQUE: Multidetector CT imaging of the abdomen and pelvis was performed using the standard protocol following bolus administration of intravenous contrast.  CONTRAST:   41m OMNIPAQUE IOHEXOL 300 MG/ML SOLN, 1061mOMNIPAQUE IOHEXOL 300 MG/ML SOLN  COMPARISON:  None.  FINDINGS: Lung bases are clear.  The liver, spleen, gallbladder, pancreas, adrenal glands, kidneys, abdominal aorta, inferior vena cava, and retroperitoneal lymph nodes are unremarkable. Stomach and small bowel are decompressed. Small accessory spleen. Gas and stool filled colon. Mild diffuse colonic distention suggesting ileus. No free air or free fluid in the abdomen.  Pelvis: Foley catheter decompresses the bladder. Prostate gland is not enlarged. Appendix is normal. No evidence of diverticulitis. No free or loculated pelvic fluid collections. There is a right perirectal fluid collection with peripheral enhancement consistent with abscess measuring 2.7 x 3.7 cm. This arises at the level of the distal rectum and appears to be above the levator muscles.  IMPRESSION: Right perirectal abscess measuring 2.7 x 3.7 cm.   Electronically Signed   By: WiLucienne Capers.D.   On: 11/03/2013 04:10    Review of Systems  Constitutional: Positive for fever. Negative for chills and weight loss.       Decreased appetite  HENT: Negative for nosebleeds.   Eyes: Negative for blurred vision.  Respiratory: Negative for cough, hemoptysis and shortness of breath.   Cardiovascular: Negative for chest pain, palpitations, orthopnea and PND.       Denies DOE  Gastrointestinal: Positive for constipation. Negative for nausea, vomiting, blood in stool and melena.  Genitourinary: Negative for dysuria and hematuria.       Urinary retention Monday - has indwelling foley  Musculoskeletal: Negative.   Skin: Negative for itching and rash.  Neurological: Negative for dizziness, focal weakness, seizures, loss of consciousness and headaches.       Denies TIAs, amaurosis fugax  Endo/Heme/Allergies: Does not bruise/bleed easily.  Psychiatric/Behavioral: The patient is not nervous/anxious.    Blood pressure 109/61, pulse 93, temperature  98.9 F (37.2 C), temperature source Oral, resp. rate 16, height 5' 10"  (1.778 m), weight 140 lb (63.504 kg), SpO2 96.00%. Physical Exam  Vitals reviewed. Constitutional: He is oriented to person, place, and time. He appears well-developed and well-nourished. No distress.  HENT:  Head: Normocephalic and atraumatic.  Right Ear: External ear normal.  Left Ear: External ear normal.  Eyes: Conjunctivae are normal. No scleral  icterus.  Neck: Normal range of motion. Neck supple. No tracheal deviation present. No thyromegaly present.  Cardiovascular: Normal rate and normal heart sounds.   Respiratory: Effort normal and breath sounds normal. No stridor. No respiratory distress. He has no wheezes.  GI: Soft. He exhibits no distension. There is no tenderness. There is no rebound and no guarding.  Genitourinary: Rectal exam shows no external hemorrhoid.  +foley; no induration/celluitis/fluctuance externally. ?small tear in post midline; good tone. Palpable fluctuant mass in post rectum-TTP  Musculoskeletal: He exhibits no edema and no tenderness.  Lymphadenopathy:    He has no cervical adenopathy.  Neurological: He is alert and oriented to person, place, and time. He exhibits normal muscle tone.  Skin: Skin is warm and dry. No rash noted. He is not diaphoretic. No erythema. No pallor.  Psychiatric: He has a normal mood and affect. His behavior is normal. Judgment and thought content normal.    Assessment/Plan: Right perirectal abscess HIV+ AIDS Urinary retention  Given immunocompromised state, pt will need operative drainage. abx alone will not be sufficient. Discussed risk/benefits of surgery With the patient. We discussed the risk of bleeding, ongoing infection, fistula formation, delayed healing, blood clot formation, ongoing urinary retention, anesthesia concerns, perioperative cardiac and pulmonary issues, and worsening infection and possible need for distal procedures. I believe his urinary  retention is due to the perirectal abscess and if additional improve once the abscess was drained  Corey Cross. Redmond Pulling, MD, FACS General, Bariatric, & Minimally Invasive Surgery [facility], [place]   [facility] M 11/03/2013, 11:07 AM

## 2013-11-03 NOTE — ED Notes (Signed)
Bed: [id number] Expected date:  Expected time:  Means of arrival:  Comments: [facility] tx

## 2013-11-04 ENCOUNTER — Encounter (HOSPITAL_COMMUNITY): Payer: Self-pay | Admitting: General Surgery

## 2013-11-04 DIAGNOSIS — R339 Retention of urine, unspecified: Secondary | ICD-10-CM

## 2013-11-04 DIAGNOSIS — K612 Anorectal abscess: Secondary | ICD-10-CM

## 2013-11-04 DIAGNOSIS — K611 Rectal abscess: Principal | ICD-10-CM

## 2013-11-04 DIAGNOSIS — Z21 Asymptomatic human immunodeficiency virus [HIV] infection status: Secondary | ICD-10-CM

## 2013-11-04 LAB — CBC
HEMATOCRIT: 41.1 % (ref 39.0–52.0)
Hemoglobin: 12.6 g/dL — ABNORMAL LOW (ref 13.0–17.0)
MCH: 28.9 pg (ref 26.0–34.0)
MCHC: 30.7 g/dL (ref 30.0–36.0)
MCV: 94.3 fL (ref 78.0–100.0)
Platelets: 161 10*3/uL (ref 150–400)
RBC: 4.36 MIL/uL (ref 4.22–5.81)
RDW: 15 % (ref 11.5–15.5)
WBC: 9 10*3/uL (ref 4.0–10.5)

## 2013-11-04 LAB — BASIC METABOLIC PANEL
ANION GAP: 10 (ref 5–15)
BUN: 6 mg/dL (ref 6–23)
CO2: 25 meq/L (ref 19–32)
CREATININE: 0.88 mg/dL (ref 0.50–1.35)
Calcium: 8.6 mg/dL (ref 8.4–10.5)
Chloride: 104 mEq/L (ref 96–112)
GFR calc Af Amer: >90 mL/min (ref >90)
GFR calc non Af Amer: >90 mL/min (ref >90)
Glucose, Bld: 93 mg/dL (ref 70–99)
Potassium: 4.7 mEq/L (ref 3.7–5.3)
Sodium: 139 mEq/L (ref 137–147)

## 2013-11-04 MED ORDER — PIPERACILLIN-TAZOBACTAM 3.375 G IVPB
3.3750 g | Freq: Three times a day (TID) | INTRAVENOUS | Status: DC
  Administered 2013-11-04 – 2013-11-05 (×4): 3.375 g via INTRAVENOUS
  Filled 2013-11-04 (×5): qty 50

## 2013-11-04 MED ORDER — SENNA 8.6 MG PO TABS
1.0000 | ORAL_TABLET | Freq: Two times a day (BID) | ORAL | Status: DC
  Administered 2013-11-04: 8.6 mg via ORAL
  Filled 2013-11-04: qty 1

## 2013-11-04 MED ORDER — VANCOMYCIN HCL IN DEXTROSE 750-5 MG/150ML-% IV SOLN
750.0000 mg | Freq: Three times a day (TID) | INTRAVENOUS | Status: DC
  Administered 2013-11-04 – 2013-11-05 (×4): 750 mg via INTRAVENOUS
  Filled 2013-11-04 (×5): qty 150

## 2013-11-04 MED ORDER — SODIUM CHLORIDE 0.9 % IV BOLUS (SEPSIS)
1000.0000 mL | Freq: Once | INTRAVENOUS | Status: AC
Start: 2013-11-04 — End: 2013-11-04
  Administered 2013-11-04: 1000 mL via INTRAVENOUS

## 2013-11-04 NOTE — Progress Notes (Signed)
ANTIBIOTIC CONSULT NOTE - INITIAL  Pharmacy Consult for Vancomycin and Zosyn Indication: wound infection  No Known Allergies  Patient Measurements: Height: 5\' 10"  (177.8 cm) Weight: 129 lb 6.6 oz (58.7 kg) IBW/kg (Calculated) : 73   Vital Signs: Temp: 98 F (36.7 C) (10/01 0527) Temp src: Oral (10/01 0527) BP: 90/50 mmHg (10/01 0641) Pulse Rate: 68 (10/01 0527) Intake/Output from previous day: 09/30 0701 - 10/01 0700 In: 1250 [P.O.:350; I.V.:900] Out: 2725 [Urine:2700; Blood:25] Intake/Output from this shift:    Labs:  Recent Labs  11/03/13 0130 11/03/13 1134 11/04/13 0435  WBC 9.0 11.3* 9.0  HGB 13.9 13.8 12.6*  PLT 198 180 161  CREATININE 0.90 0.82 0.88   Estimated Creatinine Clearance: 106.5 ml/min (by C-G formula based on Cr of 0.88). No results found for this basename: VANCOTROUGH, Leodis BinetVANCOPEAK, VANCORANDOM, GENTTROUGH, GENTPEAK, GENTRANDOM, TOBRATROUGH, TOBRAPEAK, TOBRARND, AMIKACINPEAK, AMIKACINTROU, AMIKACIN,  in the last 72 hours   Microbiology: Recent Results (from the past 720 hour(s))  SURGICAL PCR SCREEN     Status: None   Collection Time    11/03/13 12:42 PM      Result Value Ref Range Status   MRSA, PCR NEGATIVE  NEGATIVE Final   Staphylococcus aureus NEGATIVE  NEGATIVE Final   Comment:            The Xpert SA Assay (FDA     approved for NASAL specimens     in patients over 25 years of age),     is one component of     a comprehensive surveillance     program.  Test performance has     been validated by [facility] for patients greater     than or equal to 61101 year old.     It is not intended     to diagnose infection nor to     guide or monitor treatment.  GRAM STAIN     Status: None   Collection Time    11/03/13  2:24 PM      Result Value Ref Range Status   Specimen Description ABSCESS RECTAL ABSCESS   Final   Special Requests NONE   Final   Gram Stain     Final   Value: ABUNDANT WBC PRESENT, PREDOMINANTLY PMN     RARE WBC  PRESENT, PREDOMINANTLY MONONUCLEAR     RARE GRAM NEGATIVE RODS     MODERATE GRAM POSITIVE COCCI IN PAIRS IN CLUSTERS     Gram Stain Report Called to,Read Back By and Verified WithKelli Churn: S SHARPE RN [phone] 11/03/13 A NAVARRO   Report Status 11/03/2013 FINAL   Final  CULTURE, ROUTINE-ABSCESS     Status: None   Collection Time    11/03/13  2:30 PM      Result Value Ref Range Status   Specimen Description ABSCESS RECTAL ABSCESS   Final   Special Requests NONE   Final   Gram Stain     Final   Value: ABUNDANT WBC PRESENT,BOTH PMN AND MONONUCLEAR     RARE SQUAMOUS EPITHELIAL CELLS PRESENT     MODERATE GRAM POSITIVE COCCI     IN PAIRS IN CLUSTERS RARE GRAM NEGATIVE RODS     [facility]   Culture     Final   Value: NO GROWTH 1 DAY     Note: Gram Stain Report Called to,Read Back By and Verified WithKelli Churn: S SHARPE RN [phone] 11/03/13 A NAVARRO     Performed at  [facility]   Report Status PENDING   Incomplete    Medical History: Past Medical History  Diagnosis Date  . HIV positive   . AIDS   . Anal condylomata   . Rectal gonorrhea     Medications:  Scheduled:  . antiseptic oral rinse  7 mL Mouth Rinse q12n4p  . chlorhexidine  15 mL Mouth Rinse BID  . Darunavir Ethanolate  800 mg Oral Q breakfast  . docusate sodium  100 mg Oral BID  . heparin  5,000 Units Subcutaneous 3 times per day  . piperacillin-tazobactam (ZOSYN)  IV  3.375 g Intravenous Q8H  . polyethylene glycol  17 g Oral Daily  . ritonavir  100 mg Oral Q breakfast  . sodium chloride  1,000 mL Intravenous Once  . tamsulosin  0.4 mg Oral Daily  . tenofovir  300 mg Oral Daily  . vancomycin  750 mg Intravenous Q8H  . zidovudine  300 mg Oral BID   Infusions:  . sodium chloride 75 mL/hr at 11/03/13 1100   PRN: acetaminophen, acetaminophen, morphine injection, ondansetron (ZOFRAN) IV, ondansetron, oxyCODONE Assessment: 23 y.o. male, with known history of HIV (followup at [facility], most recent DC4 count is 200  in July) presents with complaints of rectal pain and urinary retention that started 5 days ago.  He is found to have a perirectal abscess on CT scan, was taken to OR this morning for drainage.  Pharmacy consulted to dose Vanc and zosyn.   Goal of Therapy:  Vanc and Zosyn per renal function  Plan:   Zosyn 3.375mg  IV EI q8h  Vanc 750mg  IV 750mg  q8h  Follow cultures and renal function  vanc trough as needed  Arley Phenix RPh 11/04/2013, 8:15 AM Pager [phone]

## 2013-11-04 NOTE — Progress Notes (Signed)
Pt doing well  No c/o Foley just removed prior to my visit late this pm  Cont flomax  Monitor for urinary retention now that foley out F/u cx May discharge from our standpoint  Mary SellaEric M. Andrey CampanileWilson, MD, FACS General, Bariatric, & Minimally Invasive Surgery [facility] [place]

## 2013-11-04 NOTE — Progress Notes (Signed)
Patient Demographics  Corey Cross, is a 23 y.o. male, DOB - [birth year], ZOX:[id number]  Admit date - 11/02/2013   Admitting Physician Huey Bienenstock, MD  Outpatient Primary MD for the patient is No PCP Per Patient  LOS - 2   Chief Complaint  Patient presents with  . Rectal Pain        Brief narrative: 23 year old male with history of HIV/AIDS followed regularly at [facility], reports he has been compliant with his medication, most recent CD4 count of 200, presents with rectal pain, was found to have perirectal abscess, which was drained by Dr. Andrey Campanile on 9/30, started on IV antibiotics empirically, Gram stain showing gram-negative rods and gram-positive cocci, but no growth in  cultures so far. As well patient was seen in [facility] emergency department on 9/28, for urinary retention where he had Foley catheter inserted then. Subjective:   Corey Cross today has, No headache, No chest pain, No abdominal pain - No Nausea, No new weakness tingling or numbness, No Cough - SOB.   Assessment & Plan    Active Problems:   Rectal abscess   Urinary retention   HIV disease  1. rectal abscess:  - Status post incision and debridement of right perirectal abscess by surgery on 9/30. - Abscess Gram stain showing gram-negative rods and gram-positive cocci, so IV antibiotic coverage was broadened, started on IV vancomycin and Rocephin on 10/1, IV Cipro and Flagyl was stopped - Continue with laxatives.  2. Urinary retention:  Will start patient on Flomax, will attempt a voiding trial today.  3. History of HIV:  Most recent CD4 count is 200 at [facility] in [place] reports he has been taking his medications regularly, repeat CD4 count is 200, patient will be resumed back on his regimen, we'll continue him on Bactrim.      Code Status: Full  Family Communication: Patient is alert and oriented, does not want his HIV status to be discussed by any family members.  Disposition Plan: Home in 24-48 hours depending on abscess culture .   Procedures   Incision and debridement of right perirectal abscess 9/30  Consults   Results for orders placed during the hospital encounter of 11/02/13  SURGICAL PCR SCREEN     Status: None   Collection Time    11/03/13 12:42 PM      Result Value Ref Range Status   MRSA, PCR NEGATIVE  NEGATIVE Final   Staphylococcus aureus NEGATIVE  NEGATIVE Final   Comment:            The Xpert SA Assay (FDA     approved for NASAL specimens     in patients over 54 years of age),     is one component of     a comprehensive surveillance     program.  Test performance has     been validated by [employer] for patients greater     than or equal to 42 year old.     It is not intended     to diagnose infection nor to     guide or monitor treatment.  GRAM STAIN     Status: None   Collection Time  11/03/13  2:24 PM      Result Value Ref Range Status   Specimen Description ABSCESS RECTAL ABSCESS   Final   Special Requests NONE   Final   Gram Stain     Final   Value: ABUNDANT WBC PRESENT, PREDOMINANTLY PMN     RARE WBC PRESENT, PREDOMINANTLY MONONUCLEAR     RARE GRAM NEGATIVE RODS     MODERATE GRAM POSITIVE COCCI IN PAIRS IN CLUSTERS     Gram Stain Report Called to,Read Back By and Verified WithKelli Churn RN [phone] 11/03/13 A [facility]O   Report Status 11/03/2013 FINAL   Final  CULTURE, ROUTINE-ABSCESS     Status: None   Collection Time    11/03/13  2:30 PM      Result Value Ref Range Status   Specimen Description ABSCESS RECTAL ABSCESS   Final   Special Requests NONE   Final   Gram Stain     Final   Value: ABUNDANT WBC PRESENT,BOTH PMN AND MONONUCLEAR     RARE SQUAMOUS EPITHELIAL CELLS PRESENT     MODERATE GRAM POSITIVE COCCI     IN PAIRS IN CLUSTERS RARE GRAM NEGATIVE  RODS     Performed by [facility]   Culture     Final   Value: NO GROWTH 1 DAY     Note: Gram Stain Report Called to,Read Back By and Verified WithKelli Churn RN [id number] 11/03/13 A [place]ARRO     Performed at [facility]   Report Status PENDING   Incomplete     Medications  Scheduled Meds: . antiseptic oral rinse  7 mL Mouth Rinse q12n4p  . chlorhexidine  15 mL Mouth Rinse BID  . Darunavir Ethanolate  800 mg Oral Q breakfast  . docusate sodium  100 mg Oral BID  . heparin  5,000 Units Subcutaneous 3 times per day  . piperacillin-tazobactam (ZOSYN)  IV  3.375 g Intravenous Q8H  . polyethylene glycol  17 g Oral Daily  . ritonavir  100 mg Oral Q breakfast  . senna  1 tablet Oral BID  . tamsulosin  0.4 mg Oral Daily  . tenofovir  300 mg Oral Daily  . vancomycin  750 mg Intravenous Q8H  . zidovudine  300 mg Oral BID   Continuous Infusions: . sodium chloride 75 mL/hr at 11/04/13 1035   PRN Meds:.acetaminophen, acetaminophen, morphine injection, ondansetron (ZOFRAN) IV, ondansetron, oxyCODONE  DVT Prophylaxis   Heparin - SCDs   Lab Results  Component Value Date   PLT 161 11/04/2013    Antibiotics   Anti-infectives   Start     Dose/Rate Route Frequency Ordered Stop   11/04/13 0800  piperacillin-tazobactam (ZOSYN) IVPB 3.375 g     3.375 g 12.5 mL/hr over 240 Minutes Intravenous Every 8 hours 11/04/13 0712     11/04/13 0800  vancomycin (VANCOCIN) IVPB 750 mg/150 ml premix     750 mg 150 mL/hr over 60 Minutes Intravenous Every 8 hours 11/04/13 0746     11/03/13 2000  ciprofloxacin (CIPRO) IVPB 400 mg  Status:  Discontinued    Comments:  Perirectal abscess   400 mg 200 mL/hr over 60 Minutes Intravenous Every 12 hours 11/03/13 1621 11/04/13 0712   11/03/13 1800  metroNIDAZOLE (FLAGYL) IVPB 500 mg  Status:  Discontinued     500 mg 100 mL/hr over 60 Minutes Intravenous Every 8 hours 11/03/13 1621 11/04/13 0712   11/03/13 1200  Darunavir Ethanolate (PREZISTA) tablet  800 mg  800 mg Oral Daily with breakfast 11/03/13 1021     11/03/13 1200  ritonavir (NORVIR) tablet 100 mg     100 mg Oral Daily with breakfast 11/03/13 1021     11/03/13 1200  tenofovir (VIREAD) tablet 300 mg  Status:  Discontinued     300 mg Oral Daily 11/03/13 1021 11/03/13 1057   11/03/13 1200  zidovudine (RETROVIR) capsule 300 mg     300 mg Oral 2 times daily 11/03/13 1021     11/03/13 1200  tenofovir (VIREAD) tablet 300 mg     300 mg Oral Daily 11/03/13 1057     11/03/13 0815  ciprofloxacin (CIPRO) IVPB 400 mg     400 mg 200 mL/hr over 60 Minutes Intravenous  Once 11/03/13 0804 11/03/13 0937   11/03/13 0815  metroNIDAZOLE (FLAGYL) IVPB 500 mg     500 mg 100 mL/hr over 60 Minutes Intravenous  Once 11/03/13 0804 11/03/13 1012          Objective:   Filed Vitals:   11/03/13 1701 11/03/13 2120 11/04/13 0527 11/04/13 0641  BP: 110/69 103/69 89/53 90/50   Pulse: 61 65 68   Temp: 98 F (36.7 C) 98 F (36.7 C) 98 F (36.7 C)   TempSrc: Oral Oral Oral   Resp: 18 18 18    Height:      Weight:      SpO2: 100% 100% 100%     Wt Readings from Last 3 Encounters:  11/03/13 58.7 kg (129 lb 6.6 oz)  11/03/13 58.7 kg (129 lb 6.6 oz)     Intake/Output Summary (Last 24 hours) at 11/04/13 1055 Last data filed at 11/04/13 0943  Gross per 24 hour  Intake   1490 ml  Output   2525 ml  Net  -1035 ml     Physical Exam  Awake Alert, Oriented X 3, No new F.N deficits, Normal affect [place].AT,PERRAL Supple Neck,No JVD, No cervical lymphadenopathy appriciated.  Symmetrical Chest wall movement, Good air movement bilaterally, CTAB RRR,No Gallops,Rubs or new Murmurs, No Parasternal Heave +ve B.Sounds, Abd Soft, No tenderness, No organomegaly appriciated, No rebound - guarding or rigidity. No Cyanosis, Clubbing or edema, No new Rash or bruise     Data Review   Micro Results Recent Results (from the past 240 hour(s))  SURGICAL PCR SCREEN     Status: None   Collection Time     11/03/13 12:42 PM      Result Value Ref Range Status   MRSA, PCR NEGATIVE  NEGATIVE Final   Staphylococcus aureus NEGATIVE  NEGATIVE Final   Comment:            The Xpert SA Assay (FDA     approved for NASAL specimens     in patients over 75 years of age),     is one component of     a comprehensive surveillance     program.  Test performance has     been validated by [employer] for patients greater     than or equal to 20 year old.     It is not intended     to diagnose infection nor to     guide or monitor treatment.  GRAM STAIN     Status: None   Collection Time    11/03/13  2:24 PM      Result Value Ref Range Status   Specimen Description ABSCESS RECTAL ABSCESS   Final   Special Requests NONE  Final   Gram Stain     Final   Value: ABUNDANT WBC PRESENT, PREDOMINANTLY PMN     RARE WBC PRESENT, PREDOMINANTLY MONONUCLEAR     RARE GRAM NEGATIVE RODS     MODERATE GRAM POSITIVE COCCI IN PAIRS IN CLUSTERS     Gram Stain Report Called to,Read Back By and Verified WithKelli Churn: S SHARPE RN [phone] 11/03/13 A [facility]ARRO   Report Status 11/03/2013 FINAL   Final  CULTURE, ROUTINE-ABSCESS     Status: None   Collection Time    11/03/13  2:30 PM      Result Value Ref Range Status   Specimen Description ABSCESS RECTAL ABSCESS   Final   Special Requests NONE   Final   Gram Stain     Final   Value: ABUNDANT WBC PRESENT,BOTH PMN AND MONONUCLEAR     RARE SQUAMOUS EPITHELIAL CELLS PRESENT     MODERATE GRAM POSITIVE COCCI     IN PAIRS IN CLUSTERS RARE GRAM NEGATIVE RODS     [facility]   Culture     Final   Value: NO GROWTH 1 DAY     Note: Gram Stain Report Called to,Read Back By and Verified WithKelli Churn: S SHARPE RN [id number] 11/03/13 A NAVARRO     [facility] at [facility]   Report Status PENDING   Incomplete    Radiology Reports Ct Abdomen Pelvis W Contrast  11/03/2013   CLINICAL DATA:  Back pain and rectal pain. Cory abscess. History of HIV.  EXAM: CT ABDOMEN AND PELVIS  WITH CONTRAST  TECHNIQUE: Multidetector CT imaging of the abdomen and pelvis was performed using the standard protocol following bolus administration of intravenous contrast.  CONTRAST:  50mL OMNIPAQUE IOHEXOL 300 MG/ML SOLN, 100mL OMNIPAQUE IOHEXOL 300 MG/ML SOLN  COMPARISON:  None.  FINDINGS: Lung bases are clear.  The liver, spleen, gallbladder, pancreas, adrenal glands, kidneys, abdominal aorta, inferior vena cava, and retroperitoneal lymph nodes are unremarkable. Stomach and small bowel are decompressed. Small accessory spleen. Gas and stool filled colon. Mild diffuse colonic distention suggesting ileus. No free air or free fluid in the abdomen.  Pelvis: Foley catheter decompresses the bladder. Prostate gland is not enlarged. Appendix is normal. No evidence of diverticulitis. No free or loculated pelvic fluid collections. There is a right perirectal fluid collection with peripheral enhancement consistent with abscess measuring 2.7 x 3.7 cm. This arises at the level of the distal rectum and appears to be above the levator muscles.  IMPRESSION: Right perirectal abscess measuring 2.7 x 3.7 cm.   Electronically Signed   By: Burman NievesWilliam  Stevens M.D.   On: 11/03/2013 04:10    CBC  Recent Labs Lab 11/03/13 0130 11/03/13 1134 11/04/13 0435  WBC 9.0 11.3* 9.0  HGB 13.9 13.8 12.6*  HCT 42.5 42.7 41.1  PLT 198 180 161  MCV 91.8 93.4 94.3  MCH 30.0 30.2 28.9  MCHC 32.7 32.3 30.7  RDW 14.3 15.0 15.0  LYMPHSABS 0.9  --   --   MONOABS 0.7  --   --   EOSABS 0.0  --   --   BASOSABS 0.0  --   --     Chemistries   Recent Labs Lab 11/03/13 0130 11/03/13 1134 11/04/13 0435  NA 138  --  139  K 4.5  --  4.7  CL 100  --  104  CO2 26  --  25  GLUCOSE 122*  --  93  BUN 9  --  6  CREATININE 0.90 0.82 0.88  CALCIUM 9.6  --  8.6   ------------------------------------------------------------------------------------------------------------------ estimated creatinine clearance is 106.5 ml/min (by C-G  formula based on Cr of 0.88). ------------------------------------------------------------------------------------------------------------------ No results found for this basename: HGBA1C,  in the last 72 hours ------------------------------------------------------------------------------------------------------------------ No results found for this basename: CHOL, HDL, LDLCALC, TRIG, CHOLHDL, LDLDIRECT,  in the last 72 hours ------------------------------------------------------------------------------------------------------------------ No results found for this basename: TSH, T4TOTAL, FREET3, T3FREE, THYROIDAB,  in the last 72 hours ------------------------------------------------------------------------------------------------------------------ No results found for this basename: VITAMINB12, FOLATE, FERRITIN, TIBC, IRON, RETICCTPCT,  in the last 72 hours  Coagulation profile No results found for this basename: INR, PROTIME,  in the last 168 hours  No results found for this basename: DDIMER,  in the last 72 hours  Cardiac Enzymes No results found for this basename: CK, CKMB, TROPONINI, MYOGLOBIN,  in the last 168 hours ------------------------------------------------------------------------------------------------------------------ No components found with this basename: POCBNP,      Time Spent in minutes   25 minutes   ELGERGAWY, DAWOOD M.D on 11/04/2013 at 10:55 AM  Between 7am to 7pm - Pager - [phone]  After 7pm go to [email or web address] - password [email or web address]  And look for the night coverage person covering for me after hours  [facility] Office  [phone]   **Disclaimer: This note may have been dictated with voice recognition software. Similar sounding words can inadvertently be transcribed and this note may contain transcription errors which may not have been corrected upon publication of note.**

## 2013-11-04 NOTE — Progress Notes (Signed)
Patient ID: Corey Cross, male   DOB: 02/03/1991, 23 y.o.   MRN: [id number]     [facility]      [address] [place], [place] [address]    Phone: [phone] FAX: [phone]     Subjective: Pain is much better.  Afebrile.  VSS.  WBC normal.    Objective:  Vital signs:  Filed Vitals:   11/03/13 1701 11/03/13 2120 11/04/13 0527 11/04/13 0641  BP: 110/69 103/69 89/53 90/50   Pulse: 61 65 68   Temp: 98 F (36.7 C) 98 F (36.7 C) 98 F (36.7 C)   TempSrc: Oral Oral Oral   Resp: 18 18 18    Height:      Weight:      SpO2: 100% 100% 100%     Last BM Date: 10/29/13  Intake/Output   Yesterday:  09/30 0701 - 10/01 0700 In: 1250 [P.O.:350; I.V.:900] Out: 2725 [Urine:2700; Blood:25] This shift:    I/O last 3 completed shifts: In: 1250 [P.O.:350; I.V.:900] Out: 2725 [Urine:2700; Blood:25]    Physical Exam: General: Pt awake/alert/oriented x4 in no acute distress Skin: no surrounding erythema, or induration, non tender, packing removed.    Problem List:   Active Problems:   Rectal abscess   Urinary retention   HIV disease    Results:   Labs: Results for orders placed during the hospital encounter of 11/02/13 (from the past 48 hour(s))  CBC WITH DIFFERENTIAL     Status: Abnormal   Collection Time    11/03/13  1:30 AM      Result Value Ref Range   WBC 9.0  4.0 - 10.5 K/uL   RBC 4.63  4.22 - 5.81 MIL/uL   Hemoglobin 13.9  13.0 - 17.0 g/dL   HCT 42.5  39.0 - 52.0 %   MCV 91.8  78.0 - 100.0 fL   MCH 30.0  26.0 - 34.0 pg   MCHC 32.7  30.0 - 36.0 g/dL   RDW 14.3  11.5 - 15.5 %   Platelets 198  150 - 400 K/uL   Neutrophils Relative % 82 (*) 43 - 77 %   Neutro Abs 7.4  1.7 - 7.7 K/uL   Lymphocytes Relative 10 (*) 12 - 46 %   Lymphs Abs 0.9  0.7 - 4.0 K/uL   Monocytes Relative 8  3 - 12 %   Monocytes Absolute 0.7  0.1 - 1.0 K/uL   Eosinophils Relative 0  0 - 5 %   Eosinophils Absolute 0.0  0.0 - 0.7 K/uL    Basophils Relative 0  0 - 1 %   Basophils Absolute 0.0  0.0 - 0.1 K/uL  BASIC METABOLIC PANEL     Status: Abnormal   Collection Time    11/03/13  1:30 AM      Result Value Ref Range   Sodium 138  137 - 147 mEq/L   Potassium 4.5  3.7 - 5.3 mEq/L   Chloride 100  96 - 112 mEq/L   CO2 26  19 - 32 mEq/L   Glucose, Bld 122 (*) 70 - 99 mg/dL   BUN 9  6 - 23 mg/dL   Creatinine, Ser 0.90  0.50 - 1.35 mg/dL   Calcium 9.6  8.4 - 10.5 mg/dL   GFR calc non Af Amer >90  >90 mL/min   GFR calc Af Amer >90  >90 mL/min   Comment: (NOTE)     The eGFR has been calculated  using the CKD EPI equation.     This calculation has not been validated in all clinical situations.     eGFR's persistently <90 mL/min signify possible Chronic Kidney     Disease.   Anion gap 12  5 - 15  T-HELPER CELLS (CD4) COUNT     Status: Abnormal   Collection Time    11/03/13  9:33 AM      Result Value Ref Range   CD4 T Cell Abs 200 (*) 400 - 2700 /uL   CD4 % Helper T Cell 24 (*) 33 - 55 %  URINALYSIS, ROUTINE W REFLEX MICROSCOPIC     Status: Abnormal   Collection Time    11/03/13 11:17 AM      Result Value Ref Range   Color, Urine YELLOW  YELLOW   APPearance CLEAR  CLEAR   Specific Gravity, Urine 1.022  1.005 - 1.030   pH 6.5  5.0 - 8.0   Glucose, UA NEGATIVE  NEGATIVE mg/dL   Hgb urine dipstick NEGATIVE  NEGATIVE   Bilirubin Urine NEGATIVE  NEGATIVE   Ketones, ur NEGATIVE  NEGATIVE mg/dL   Protein, ur NEGATIVE  NEGATIVE mg/dL   Urobilinogen, UA 0.2  0.0 - 1.0 mg/dL   Nitrite NEGATIVE  NEGATIVE   Leukocytes, UA TRACE (*) NEGATIVE  URINE MICROSCOPIC-ADD ON     Status: Abnormal   Collection Time    11/03/13 11:17 AM      Result Value Ref Range   WBC, UA 3-6  <3 WBC/hpf   RBC / HPF 3-6  <3 RBC/hpf   Bacteria, UA FEW (*) RARE   Urine-Other MUCOUS PRESENT    CBC     Status: Abnormal   Collection Time    11/03/13 11:34 AM      Result Value Ref Range   WBC 11.3 (*) 4.0 - 10.5 K/uL   RBC 4.57  4.22 - 5.81 MIL/uL    Hemoglobin 13.8  13.0 - 17.0 g/dL   HCT 42.7  39.0 - 52.0 %   MCV 93.4  78.0 - 100.0 fL   MCH 30.2  26.0 - 34.0 pg   MCHC 32.3  30.0 - 36.0 g/dL   RDW 15.0  11.5 - 15.5 %   Platelets 180  150 - 400 K/uL  CREATININE, SERUM     Status: None   Collection Time    11/03/13 11:34 AM      Result Value Ref Range   Creatinine, Ser 0.82  0.50 - 1.35 mg/dL   GFR calc non Af Amer >90  >90 mL/min   GFR calc Af Amer >90  >90 mL/min   Comment: (NOTE)     The eGFR has been calculated using the CKD EPI equation.     This calculation has not been validated in all clinical situations.     eGFR's persistently <90 mL/min signify possible Chronic Kidney     Disease.  SURGICAL PCR SCREEN     Status: None   Collection Time    11/03/13 12:42 PM      Result Value Ref Range   MRSA, PCR NEGATIVE  NEGATIVE   Staphylococcus aureus NEGATIVE  NEGATIVE   Comment:            The Xpert SA Assay (FDA     approved for NASAL specimens     in patients over 43 years of age),     is one component of     a comprehensive surveillance  program.  Test performance has     been validated by [facility] for patients greater     than or equal to 28 year old.     It is not intended     to diagnose infection nor to     guide or monitor treatment.  GRAM STAIN     Status: None   Collection Time    11/03/13  2:24 PM      Result Value Ref Range   Specimen Description ABSCESS RECTAL ABSCESS     Special Requests NONE     Gram Stain       Value: ABUNDANT WBC PRESENT, PREDOMINANTLY PMN     RARE WBC PRESENT, PREDOMINANTLY MONONUCLEAR     RARE GRAM NEGATIVE RODS     MODERATE GRAM POSITIVE COCCI IN PAIRS IN CLUSTERS     Gram Stain Report Called to,Read Back By and Verified With: S Tobin Chad RN [phone] 11/03/13 A NAVARRO   Report Status 11/03/2013 FINAL    CULTURE, ROUTINE-ABSCESS     Status: None   Collection Time    11/03/13  2:30 PM      Result Value Ref Range   Specimen Description ABSCESS RECTAL ABSCESS     Special  Requests NONE     Gram Stain       Value: ABUNDANT WBC PRESENT,BOTH PMN AND MONONUCLEAR     RARE SQUAMOUS EPITHELIAL CELLS PRESENT     MODERATE GRAM POSITIVE COCCI     IN PAIRS IN CLUSTERS RARE GRAM NEGATIVE RODS     Performed by [facility]   Culture       Value: NO GROWTH 1 DAY     Note: Gram Stain Report Called to,Read Back By and Verified With: [place]o [phone] 11/03/13 A NAVARRO     Performed at [facility]   Report Status PENDING    CBC     Status: Abnormal   Collection Time    11/04/13  4:35 AM      Result Value Ref Range   WBC 9.0  4.0 - 10.5 K/uL   RBC 4.36  4.22 - 5.81 MIL/uL   Hemoglobin 12.6 (*) 13.0 - 17.0 g/dL   HCT 41.1  39.0 - 52.0 %   MCV 94.3  78.0 - 100.0 fL   MCH 28.9  26.0 - 34.0 pg   MCHC 30.7  30.0 - 36.0 g/dL   RDW 15.0  11.5 - 15.5 %   Platelets 161  150 - 400 K/uL  BASIC METABOLIC PANEL     Status: None   Collection Time    11/04/13  4:35 AM      Result Value Ref Range   Sodium 139  137 - 147 mEq/L   Potassium 4.7  3.7 - 5.3 mEq/L   Chloride 104  96 - 112 mEq/L   CO2 25  19 - 32 mEq/L   Glucose, Bld 93  70 - 99 mg/dL   BUN 6  6 - 23 mg/dL   Creatinine, Ser 0.88  0.50 - 1.35 mg/dL   Calcium 8.6  8.4 - 10.5 mg/dL   GFR calc non Af Amer >90  >90 mL/min   GFR calc Af Amer >90  >90 mL/min   Comment: (NOTE)     The eGFR has been calculated using the CKD EPI equation.     This calculation has not been validated in all clinical situations.     eGFR's  persistently <90 mL/min signify possible Chronic Kidney     Disease.   Anion gap 10  5 - 15    Imaging / Studies: Ct Abdomen Pelvis W Contrast  11/03/2013   CLINICAL DATA:  Back pain and rectal pain. Cory abscess. History of HIV.  EXAM: CT ABDOMEN AND PELVIS WITH CONTRAST  TECHNIQUE: Multidetector CT imaging of the abdomen and pelvis was performed using the standard protocol following bolus administration of intravenous contrast.  CONTRAST:  62m OMNIPAQUE IOHEXOL 300 MG/ML SOLN, 1059m OMNIPAQUE IOHEXOL 300 MG/ML SOLN  COMPARISON:  None.  FINDINGS: Lung bases are clear.  The liver, spleen, gallbladder, pancreas, adrenal glands, kidneys, abdominal aorta, inferior vena cava, and retroperitoneal lymph nodes are unremarkable. Stomach and small bowel are decompressed. Small accessory spleen. Gas and stool filled colon. Mild diffuse colonic distention suggesting ileus. No free air or free fluid in the abdomen.  Pelvis: Foley catheter decompresses the bladder. Prostate gland is not enlarged. Appendix is normal. No evidence of diverticulitis. No free or loculated pelvic fluid collections. There is a right perirectal fluid collection with peripheral enhancement consistent with abscess measuring 2.7 x 3.7 cm. This arises at the level of the distal rectum and appears to be above the levator muscles.  IMPRESSION: Right perirectal abscess measuring 2.7 x 3.7 cm.   Electronically Signed   By: WiLucienne Capers.D.   On: 11/03/2013 04:10    Medications / Allergies:  Scheduled Meds: . antiseptic oral rinse  7 mL Mouth Rinse q12n4p  . chlorhexidine  15 mL Mouth Rinse BID  . Darunavir Ethanolate  800 mg Oral Q breakfast  . docusate sodium  100 mg Oral BID  . heparin  5,000 Units Subcutaneous 3 times per day  . piperacillin-tazobactam (ZOSYN)  IV  3.375 g Intravenous Q8H  . polyethylene glycol  17 g Oral Daily  . ritonavir  100 mg Oral Q breakfast  . sodium chloride  1,000 mL Intravenous Once  . tamsulosin  0.4 mg Oral Daily  . tenofovir  300 mg Oral Daily  . vancomycin  750 mg Intravenous Q8H  . zidovudine  300 mg Oral BID   Continuous Infusions: . sodium chloride 75 mL/hr at 11/03/13 1100   PRN Meds:.acetaminophen, acetaminophen, morphine injection, ondansetron (ZOFRAN) IV, ondansetron, oxyCODONE  Antibiotics: Anti-infectives   Start     Dose/Rate Route Frequency Ordered Stop   11/04/13 0800  piperacillin-tazobactam (ZOSYN) IVPB 3.375 g     3.375 g 12.5 mL/hr over 240 Minutes  Intravenous Every 8 hours 11/04/13 0712     11/04/13 0800  vancomycin (VANCOCIN) IVPB 750 mg/150 ml premix     750 mg 150 mL/hr over 60 Minutes Intravenous Every 8 hours 11/04/13 0746     11/03/13 2000  ciprofloxacin (CIPRO) IVPB 400 mg  Status:  Discontinued    Comments:  Perirectal abscess   400 mg 200 mL/hr over 60 Minutes Intravenous Every 12 hours 11/03/13 1621 11/04/13 0712   11/03/13 1800  metroNIDAZOLE (FLAGYL) IVPB 500 mg  Status:  Discontinued     500 mg 100 mL/hr over 60 Minutes Intravenous Every 8 hours 11/03/13 1621 11/04/13 0712   11/03/13 1200  Darunavir Ethanolate (PREZISTA) tablet 800 mg     800 mg Oral Daily with breakfast 11/03/13 1021     11/03/13 1200  ritonavir (NORVIR) tablet 100 mg     100 mg Oral Daily with breakfast 11/03/13 1021     11/03/13 1200  tenofovir (VIREAD) tablet 300 mg  Status:  Discontinued     300 mg Oral Daily 11/03/13 1021 11/03/13 1057   11/03/13 1200  zidovudine (RETROVIR) capsule 300 mg     300 mg Oral 2 times daily 11/03/13 1021     11/03/13 1200  tenofovir (VIREAD) tablet 300 mg     300 mg Oral Daily 11/03/13 1057     11/03/13 0815  ciprofloxacin (CIPRO) IVPB 400 mg     400 mg 200 mL/hr over 60 Minutes Intravenous  Once 11/03/13 0804 11/03/13 0937   11/03/13 0815  metroNIDAZOLE (FLAGYL) IVPB 500 mg     500 mg 100 mL/hr over 60 Minutes Intravenous  Once 11/03/13 0804 11/03/13 1012        Assessment/Plan Right perirectal abscess  HIV+  AIDS POD#1 irrigation and debridement of perirectal abscess---Dr. Redmond Pulling -sitz baths daily and after each BM --follow cultures, negative to date -antibiotics per primary team -recommend stool softeners, adequate oral hydration -may discharge from surgical standpoint   Erby Pian, [facility] Surgery Pager [phone])   11/04/2013 8:37 AM

## 2013-11-05 LAB — CBC
HCT: 39.9 % (ref 39.0–52.0)
HEMOGLOBIN: 12.7 g/dL — AB (ref 13.0–17.0)
MCH: 29.7 pg (ref 26.0–34.0)
MCHC: 31.8 g/dL (ref 30.0–36.0)
MCV: 93.4 fL (ref 78.0–100.0)
Platelets: 183 10*3/uL (ref 150–400)
RBC: 4.27 MIL/uL (ref 4.22–5.81)
RDW: 14.8 % (ref 11.5–15.5)
WBC: 5.8 10*3/uL (ref 4.0–10.5)

## 2013-11-05 LAB — BASIC METABOLIC PANEL
Anion gap: 12 (ref 5–15)
BUN: 10 mg/dL (ref 6–23)
CO2: 21 mEq/L (ref 19–32)
Calcium: 8.7 mg/dL (ref 8.4–10.5)
Chloride: 101 mEq/L (ref 96–112)
Creatinine, Ser: 0.84 mg/dL (ref 0.50–1.35)
GFR calc Af Amer: >90 mL/min (ref >90)
GLUCOSE: 81 mg/dL (ref 70–99)
POTASSIUM: 5.1 meq/L (ref 3.7–5.3)
Sodium: 134 mEq/L — ABNORMAL LOW (ref 137–147)

## 2013-11-05 MED ORDER — AMOXICILLIN-POT CLAVULANATE 875-125 MG PO TABS: 1.0000 | ORAL_TABLET | Freq: Two times a day (BID) | ORAL | Status: AC

## 2013-11-05 MED ORDER — TAMSULOSIN HCL 0.4 MG PO CAPS: 0.4000 mg | ORAL_CAPSULE | Freq: Every day | ORAL | Status: DC

## 2013-11-05 MED ORDER — SENNA 8.6 MG PO TABS: 1.0000 | ORAL_TABLET | Freq: Two times a day (BID) | ORAL | Status: DC

## 2013-11-05 MED ORDER — DSS 100 MG PO CAPS: 100.0000 mg | ORAL_CAPSULE | Freq: Two times a day (BID) | ORAL | Status: DC

## 2013-11-05 NOTE — Care Management Note (Signed)
    Page 1 of 1   11/05/2013     3:12:10 PM CARE MANAGEMENT NOTE 11/05/2013  Patient:  Corey Cross,Corey Cross   Account Number:  [id number]  Date Initiated:  11/05/2013  Documentation initiated by:  Lanier ClamMAHABIR,Demetrius Mahler  Subjective/Objective Assessment:   23 Y/O M ADMITTED W/RECTAL ABSCESS.     Action/Plan:   FROM HOME.USES ADAP FOR PCP-RACHEL MILLER,& MEDS.   Anticipated DC Date:  11/05/2013   Anticipated DC Plan:  HOME/SELF CARE      DC Planning Services  CM consult      Choice offered to / List presented to:             Status of service:  Completed, signed off Medicare Important Message given?   (If response is "NO", the following Medicare IM given date fields will be blank) Date Medicare IM given:   Medicare IM given by:   Date Additional Medicare IM given:   Additional Medicare IM given by:    Discharge Disposition:  HOME/SELF CARE  Per UR Regulation:  Reviewed for med. necessity/level of care/duration of stay  If discussed at Long Length of Stay Meetings, dates discussed:    Comments:  11/05/13 Nakesha Ebrahim RN,BSN NCM [phone] NO D/C NEEDS.CSW FOLLOWING FOR POSSIBLE TRANSP NEEDS IF PATIENT UNABLE TO GET A RIDE FOR D/C HOME.

## 2013-11-05 NOTE — Discharge Instructions (Signed)
Sitz Bath                A sitz bath is a warm water bath taken in the sitting position that covers only the hips and buttocks. It may be used for either healing or hygiene purposes. Sitz baths are also used to relieve pain, itching, or muscle spasms. The water may contain medicine. Moist heat will help you heal and relax.  HOME CARE INSTRUCTIONS  Take 3 to 4 sitz baths a day.  1. Fill the bathtub half full with warm water. 2. Sit in the water and open the drain a little. 3. Turn on the warm water to keep the tub half full. Keep the water running constantly. 4. Soak in the water for 15 to 20 minutes. 5. After the sitz bath, pat the affected area dry first. SEEK MEDICAL CARE IF:  You get worse instead of better. Stop the sitz baths if you get worse.  MAKE SURE YOU:  Understand these instructions.  Will watch your condition.  Will get help right away if you are not doing well or get worse.  [facility] _______[facility]  RECTAL SURGERY POST OP INSTRUCTIONS: POST OP INSTRUCTIONS  Always review your discharge instruction sheet given to you by the facility where your surgery was performed. IF YOU HAVE DISABILITY OR FAMILY LEAVE FORMS, YOU MUST BRING THEM TO THE OFFICE FOR PROCESSING.   DO NOT GIVE THEM TO YOUR DOCTOR.  1. A  prescription for pain medication may be given to you upon discharge.  Take your pain medication as prescribed, if needed.  If narcotic pain medicine is not needed, then you may take acetaminophen (Tylenol) or ibuprofen (Advil) as needed. 2. Take your usually prescribed medications unless otherwise directed. 3. If you need a refill on your pain medication, please contact your pharmacy.  They will contact our office to request authorization. Prescriptions will not be filled after 5 pm or on week-ends. 4. You should follow a light diet the first 48 hours after arrival home, such as soup and crackers, etc.  Be sure to include lots of fluids daily.   Resume your normal diet 2-3 days after surgery.. 5. Most patients will experience some swelling and discomfort in the rectal area. Ice packs, reclining and warm tub soaks will help.  Swelling and discomfort can take several days to resolve.  6. It is common to experience some constipation if taking pain medication after surgery.  Increasing fluid intake and taking a stool softener (such as Colace) will usually help or prevent this problem from occurring.  A mild laxative (Milk of Magnesia or Miralax) should be taken according to package directions if there are no bowel movements after 48 hours. 7. Unless discharge instructions indicate otherwise, leave your bandage dry and in place for 24 hours, or remove the bandage if you have a bowel movement. You may notice a small amount of bleeding with bowel movements for the first few days. You may have some packing in the rectum which will come out over the first day or two. You will need to wear an absorbent pad or soft cotton gauze in your underwear until the drainage stops.it. 8. ACTIVITIES:  You may resume regular (light) daily activities beginning the next day--such as daily self-care, walking, climbing stairs--gradually increasing activities as tolerated.  You may have sexual intercourse when it is comfortable.  Refrain from any heavy lifting or straining until approved by your doctor. a. You may drive when you  are no longer taking prescription pain medication, you can comfortably wear a seatbelt, and you can safely maneuver your car and apply brakes. b. RETURN TO WORK: : ____________________ c.  9. You should see your doctor in the office for a follow-up appointment approximately 2-3 weeks after your surgery.  Make sure that you call for this appointment within a day or two after you arrive home to insure a convenient appointment time. 10. OTHER INSTRUCTIONS:   __________________________________________________________________________________________________________________________________________________________________________________________  WHEN TO CALL YOUR DOCTOR: 1. Fever over 101.0 2. Inability to urinate 3. Nausea and/or vomiting 4. Extreme swelling or bruising 5. Continued bleeding from rectum. 6. Increased pain, redness, or drainage from the incision 7. Constipation  The clinic staff is available to answer your questions during regular business hours.  Please dont hesitate to call and ask to speak to one of the nurses for clinical concerns.  If you have a medical emergency, go to the nearest emergency room or call 911.  [facility] is always on call at the hospital   [address], [address], [place], [place]  [address] ?  [address] [address], [place], [place]   [id number] [phone] ? [phone] ? FAX [phone]) [phone] Web site: [email or web address]    Follow urology within 2 weeks from discharge. Follow up with [place] [facility] within 10 days from discharge. Continue take over-the-counter laxatives including Senokot one tablet 2 times a day, Colace 1 tablet 2 times a day, and MiraLAX as needed. Please follow surgery discharge instructions, guarding wound and sitz bath.

## 2013-11-05 NOTE — Progress Notes (Signed)
Patient ID: Corey Cross, male   DOB: Mar 20, 1990, 23 y.o.   MRN: [id number]     [facility]      [address] [place], [place] [address]    Phone: [phone] FAX: [phone]     Subjective: Much better.  Very little discomfort.  Afebrile.  VSS.    Objective:  Vital signs:  Filed Vitals:   11/04/13 0641 11/04/13 1410 11/04/13 2119 11/05/13 0408  BP: 90/50 92/54 98/64  133/74  Pulse:  62 72 64  Temp:  98.1 F (36.7 C) 98.6 F (37 C) 98.5 F (36.9 C)  TempSrc:  Oral Oral Oral  Resp:  20 18 18   Height:      Weight:      SpO2:  100% 99% 100%    Last BM Date: 10/06/13 (pt states he had 4 last night- 2 were loose)  Intake/Output   Yesterday:  10/01 0701 - 10/02 0700 In: 2536.3 [P.O.:480; I.V.:1856.3; IV Piggyback:200] Out: 1975 [Urine:1975] This shift:  Total I/O In: 240 [P.O.:240] Out: -     Physical Exam: General: Pt awake/alert/oriented x4 in no acute distress Skin: c/d/i, no erythema.    Problem List:   Active Problems:   Rectal abscess   Urinary retention   HIV disease    Results:   Labs: Results for orders placed during the hospital encounter of 11/02/13 (from the past 48 hour(s))  URINALYSIS, ROUTINE W REFLEX MICROSCOPIC     Status: Abnormal   Collection Time    11/03/13 11:17 AM      Result Value Ref Range   Color, Urine YELLOW  YELLOW   APPearance CLEAR  CLEAR   Specific Gravity, Urine 1.022  1.005 - 1.030   pH 6.5  5.0 - 8.0   Glucose, UA NEGATIVE  NEGATIVE mg/dL   Hgb urine dipstick NEGATIVE  NEGATIVE   Bilirubin Urine NEGATIVE  NEGATIVE   Ketones, ur NEGATIVE  NEGATIVE mg/dL   Protein, ur NEGATIVE  NEGATIVE mg/dL   Urobilinogen, UA 0.2  0.0 - 1.0 mg/dL   Nitrite NEGATIVE  NEGATIVE   Leukocytes, UA TRACE (*) NEGATIVE  URINE MICROSCOPIC-ADD ON     Status: Abnormal   Collection Time    11/03/13 11:17 AM      Result Value Ref Range   WBC, UA 3-6  <3 WBC/hpf   RBC / HPF 3-6  <3  RBC/hpf   Bacteria, UA FEW (*) RARE   Urine-Other MUCOUS PRESENT    CBC     Status: Abnormal   Collection Time    11/03/13 11:34 AM      Result Value Ref Range   WBC 11.3 (*) 4.0 - 10.5 K/uL   RBC 4.57  4.22 - 5.81 MIL/uL   Hemoglobin 13.8  13.0 - 17.0 g/dL   HCT 42.7  39.0 - 52.0 %   MCV 93.4  78.0 - 100.0 fL   MCH 30.2  26.0 - 34.0 pg   MCHC 32.3  30.0 - 36.0 g/dL   RDW 15.0  11.5 - 15.5 %   Platelets 180  150 - 400 K/uL  CREATININE, SERUM     Status: None   Collection Time    11/03/13 11:34 AM      Result Value Ref Range   Creatinine, Ser 0.82  0.50 - 1.35 mg/dL   GFR calc non Af Amer >90  >90 mL/min   GFR calc Af Amer >90  >90 mL/min  Comment: (NOTE)     The eGFR has been calculated using the CKD EPI equation.     This calculation has not been validated in all clinical situations.     eGFR's persistently <90 mL/min signify possible Chronic Kidney     Disease.  SURGICAL PCR SCREEN     Status: None   Collection Time    11/03/13 12:42 PM      Result Value Ref Range   MRSA, PCR NEGATIVE  NEGATIVE   Staphylococcus aureus NEGATIVE  NEGATIVE   Comment:            The Xpert SA Assay (FDA     approved for NASAL specimens     in patients over 34 years of age),     is one component of     a comprehensive surveillance     program.  Test performance has     been validated by [employer] for patients greater     than or equal to 16 year old.     It is not intended     to diagnose infection nor to     guide or monitor treatment.  GRAM STAIN     Status: None   Collection Time    11/03/13  2:24 PM      Result Value Ref Range   Specimen Description ABSCESS RECTAL ABSCESS     Special Requests NONE     Gram Stain       Value: ABUNDANT WBC PRESENT, PREDOMINANTLY PMN     RARE WBC PRESENT, PREDOMINANTLY MONONUCLEAR     RARE GRAM NEGATIVE RODS     MODERATE GRAM POSITIVE COCCI IN PAIRS IN CLUSTERS     Gram Stain Report Called to,Read Back By and Verified WithCarlene Coria RN  [id number] 11/03/13 A NAVARRO   Report Status 11/03/2013 FINAL    ANAEROBIC CULTURE     Status: None   Collection Time    11/03/13  2:30 PM      Result Value Ref Range   Specimen Description ABSCESS RECTAL ABSCESS     Special Requests NONE     Gram Stain PENDING     Culture       Value: NO ANAEROBES ISOLATED; CULTURE IN PROGRESS FOR 5 DAYS     Performed at [facility]   Report Status PENDING    CULTURE, ROUTINE-ABSCESS     Status: None   Collection Time    11/03/13  2:30 PM      Result Value Ref Range   Specimen Description ABSCESS RECTAL ABSCESS     Special Requests NONE     Gram Stain       Value: ABUNDANT WBC PRESENT,BOTH PMN AND MONONUCLEAR     RARE SQUAMOUS EPITHELIAL CELLS PRESENT     MODERATE GRAM POSITIVE COCCI     IN PAIRS IN CLUSTERS RARE [place] NEGATIVE RODS     Performed by [facility]   Culture       Value: NO GROWTH 1 DAY     Note: Gram Stain Report Called to,Read Back By and Verified WithCarlene Coria RN [id number] 11/03/13 A NAVARRO     Performed at [facility]   Report Status PENDING    CBC     Status: Abnormal   Collection Time    11/04/13  4:35 AM      Result Value Ref Range   WBC 9.0  4.0 - 10.5 K/uL  RBC 4.36  4.22 - 5.81 MIL/uL   Hemoglobin 12.6 (*) 13.0 - 17.0 g/dL   HCT 41.1  39.0 - 52.0 %   MCV 94.3  78.0 - 100.0 fL   MCH 28.9  26.0 - 34.0 pg   MCHC 30.7  30.0 - 36.0 g/dL   RDW 15.0  11.5 - 15.5 %   Platelets 161  150 - 400 K/uL  BASIC METABOLIC PANEL     Status: None   Collection Time    11/04/13  4:35 AM      Result Value Ref Range   Sodium 139  137 - 147 mEq/L   Potassium 4.7  3.7 - 5.3 mEq/L   Chloride 104  96 - 112 mEq/L   CO2 25  19 - 32 mEq/L   Glucose, Bld 93  70 - 99 mg/dL   BUN 6  6 - 23 mg/dL   Creatinine, Ser 0.88  0.50 - 1.35 mg/dL   Calcium 8.6  8.4 - 10.5 mg/dL   GFR calc non Af Amer >90  >90 mL/min   GFR calc Af Amer >90  >90 mL/min   Comment: (NOTE)     The eGFR has been calculated using the CKD EPI  equation.     This calculation has not been validated in all clinical situations.     eGFR's persistently <90 mL/min signify possible Chronic Kidney     Disease.   Anion gap 10  5 - 15  CBC     Status: Abnormal   Collection Time    11/05/13  3:55 AM      Result Value Ref Range   WBC 5.8  4.0 - 10.5 K/uL   RBC 4.27  4.22 - 5.81 MIL/uL   Hemoglobin 12.7 (*) 13.0 - 17.0 g/dL   HCT 39.9  39.0 - 52.0 %   MCV 93.4  78.0 - 100.0 fL   MCH 29.7  26.0 - 34.0 pg   MCHC 31.8  30.0 - 36.0 g/dL   RDW 14.8  11.5 - 15.5 %   Platelets 183  150 - 400 K/uL  BASIC METABOLIC PANEL     Status: Abnormal   Collection Time    11/05/13  3:55 AM      Result Value Ref Range   Sodium 134 (*) 137 - 147 mEq/L   Potassium 5.1  3.7 - 5.3 mEq/L   Comment: SLIGHT HEMOLYSIS     HEMOLYSIS AT THIS LEVEL MAY AFFECT RESULT   Chloride 101  96 - 112 mEq/L   CO2 21  19 - 32 mEq/L   Glucose, Bld 81  70 - 99 mg/dL   BUN 10  6 - 23 mg/dL   Creatinine, Ser 0.84  0.50 - 1.35 mg/dL   Calcium 8.7  8.4 - 10.5 mg/dL   GFR calc non Af Amer >90  >90 mL/min   GFR calc Af Amer >90  >90 mL/min   Comment: (NOTE)     The eGFR has been calculated using the CKD EPI equation.     This calculation has not been validated in all clinical situations.     eGFR's persistently <90 mL/min signify possible Chronic Kidney     Disease.   Anion gap 12  5 - 15    Imaging / Studies: No results found.  Medications / Allergies:  Scheduled Meds: . antiseptic oral rinse  7 mL Mouth Rinse q12n4p  . chlorhexidine  15 mL Mouth Rinse BID  . Darunavir  Ethanolate  800 mg Oral Q breakfast  . docusate sodium  100 mg Oral BID  . heparin  5,000 Units Subcutaneous 3 times per day  . piperacillin-tazobactam (ZOSYN)  IV  3.375 g Intravenous Q8H  . polyethylene glycol  17 g Oral Daily  . ritonavir  100 mg Oral Q breakfast  . senna  1 tablet Oral BID  . tamsulosin  0.4 mg Oral Daily  . tenofovir  300 mg Oral Daily  . vancomycin  750 mg Intravenous Q8H   . zidovudine  300 mg Oral BID   Continuous Infusions: . sodium chloride 75 mL/hr at 11/04/13 1035   PRN Meds:.acetaminophen, acetaminophen, morphine injection, ondansetron (ZOFRAN) IV, ondansetron, oxyCODONE  Antibiotics: Anti-infectives   Start     Dose/Rate Route Frequency Ordered Stop   11/04/13 0800  piperacillin-tazobactam (ZOSYN) IVPB 3.375 g     3.375 g 12.5 mL/hr over 240 Minutes Intravenous Every 8 hours 11/04/13 0712     11/04/13 0800  vancomycin (VANCOCIN) IVPB 750 mg/150 ml premix     750 mg 150 mL/hr over 60 Minutes Intravenous Every 8 hours 11/04/13 0746     11/03/13 2000  ciprofloxacin (CIPRO) IVPB 400 mg  Status:  Discontinued    Comments:  Perirectal abscess   400 mg 200 mL/hr over 60 Minutes Intravenous Every 12 hours 11/03/13 1621 11/04/13 0712   11/03/13 1800  metroNIDAZOLE (FLAGYL) IVPB 500 mg  Status:  Discontinued     500 mg 100 mL/hr over 60 Minutes Intravenous Every 8 hours 11/03/13 1621 11/04/13 0712   11/03/13 1200  Darunavir Ethanolate (PREZISTA) tablet 800 mg     800 mg Oral Daily with breakfast 11/03/13 1021     11/03/13 1200  ritonavir (NORVIR) tablet 100 mg     100 mg Oral Daily with breakfast 11/03/13 1021     11/03/13 1200  tenofovir (VIREAD) tablet 300 mg  Status:  Discontinued     300 mg Oral Daily 11/03/13 1021 11/03/13 1057   11/03/13 1200  zidovudine (RETROVIR) capsule 300 mg     300 mg Oral 2 times daily 11/03/13 1021     11/03/13 1200  tenofovir (VIREAD) tablet 300 mg     300 mg Oral Daily 11/03/13 1057     11/03/13 0815  ciprofloxacin (CIPRO) IVPB 400 mg     400 mg 200 mL/hr over 60 Minutes Intravenous  Once 11/03/13 0804 11/03/13 0937   11/03/13 0815  metroNIDAZOLE (FLAGYL) IVPB 500 mg     500 mg 100 mL/hr over 60 Minutes Intravenous  Once 11/03/13 0804 11/03/13 1012        Assessment/Plan Right perirectal abscess  HIV+  AIDS  POD#2 irrigation and debridement of perirectal abscess---Dr. Redmond Pulling  -sitz baths daily and after  each BM  --follow cultures, moderate GPC -antibiotics per primary team/ID  -recommend stool softeners, adequate oral hydration  -may discharge from surgical standpoint   Erby Pian, [facility] Pager [phone])   11/05/2013 10:03 AM

## 2013-11-05 NOTE — Progress Notes (Signed)
Clinical Social Work  Therapist, sports reports that patient needs assistance with transportation. CSW met with patient and provided bus pass. Patient reports he has money for [employer] bus [place]. CSW is signing off but available if needed.  [place], [place] [phone]

## 2013-11-05 NOTE — Progress Notes (Signed)
Doing well. No urinary retention. Had BM Min drainage No fever. No wbc.  cx - GPC, some rods  Alert, nad,  Soft, nt, nd  Ok for discharge from our standpoint Would rec at least 1 week of abx Discussed dc instruction with pt F/u with me in clinic about 10 days  Mary Sellaric M. Andrey CampanileWilson, MD, FACS General, Bariatric, & Minimally Invasive Surgery [facility], [place]

## 2013-11-05 NOTE — Discharge Summary (Addendum)
Corey Cross, 23 y.o., DOB March 11, 1990, MRN [id number]. Admission date: 11/02/2013 Discharge Date 11/05/2013 Primary MD No PCP Per Patient Admitting Physician Huey Bienenstock, MD  Admission Diagnosis  Perirectal abscess [K61.1] Urinary retention [R33.9] HIV (human immunodeficiency virus infection) [Z21]  Discharge Diagnosis   Active Problems:   Rectal abscess   Urinary retention   HIV disease      Past Medical History  Diagnosis Date  . HIV positive   . AIDS   . Anal condylomata   . Rectal gonorrhea     Past Surgical History  Procedure Laterality Date  . Warts removed    . Incision and drainage perirectal abscess N/A 11/03/2013    Procedure: IRRIGATION AND DEBRIDEMENT PERIRECTAL ABSCESS;  Surgeon: Atilano Ina, MD;  Location: [facility];  Service: General;  Laterality: N/A;   Brief narrative:  23 year old male with history of HIV/AIDS followed regularly at [facility], reports he has been compliant with his medication, most recent CD4 count of 200, presents with rectal pain, was found to have perirectal abscess, which was drained by Dr. Andrey Campanile on 9/30, started on IV antibiotics empirically initially IV Cipro and Flagyl, then changed to IV vancomycin and Zosyn, Gram stain showing rare gram-negative rods and gram-positive cocci, but no growth in cultures so far. Patient is febrile, with no leukocytosis, the patient is being discharged on total of 14 days of oral Augmentin, with recommendation to follow with surgery in 10 days. As well patient was seen in [facility] regional emergency department on 9/28, for urinary retention where he had Foley catheter inserted then, patient was started on Flomax to hospitalization, had his Foley catheter discontinued on 10/1, since then patient has been voiding well with no problems, was instructed to keep his urology appointment at [facility], and continue taking Flomax for the next 2 weeks, and to come back to ED for any extended  retention problem.   Hospital Course See H&P, Labs, Consult and Test reports for all details in brief, patient was admitted for **  Active Problems:   Rectal abscess   Urinary retention   HIV disease 1. rectal abscess:  - Status post incision and debridement of right perirectal abscess by surgery on 9/30.  - Abscess Gram stain showing gram-negative rods and gram-positive cocci, - Continue with laxatives upon discharge.  2. Urinary retention:  Foley catheter was discontinued 10/1, patient has no further symptoms or complaints of retention, will continue on Flomax for the next 15 days , till he is seen by your as an outpatient.  3. History of HIV:  Most recent CD4 count is 200 at [facility] in [place] reports he has been taking his medications regularly, repeat CD4 count is 200, patient will be resumed back on his regimen during hospitalization, and instructed to continue upon discharge.      Consults   Surgical consult  Procedures Incision and debridement of right perirectal abscess 9/30  Significant Tests:  See full reports for all details    Ct Abdomen Pelvis W Contrast  11/03/2013   CLINICAL DATA:  Back pain and rectal pain. Cory abscess. History of HIV.  EXAM: CT ABDOMEN AND PELVIS WITH CONTRAST  TECHNIQUE: Multidetector CT imaging of the abdomen and pelvis was performed using the standard protocol following bolus administration of intravenous contrast.  CONTRAST:  50mL OMNIPAQUE IOHEXOL 300 MG/ML SOLN, OMNIPAQUE IOHEXOL 300 MG/ML SOLN  COMPARISON:  None.  FINDINGS: Lung bases are clear.  The liver, spleen, gallbladder,  pancreas, adrenal glands, kidneys, abdominal aorta, inferior vena cava, and retroperitoneal lymph nodes are unremarkable. Stomach and small bowel are decompressed. Small accessory spleen. Gas and stool filled colon. Mild diffuse colonic distention suggesting ileus. No free air or free fluid in the abdomen.  Pelvis: Foley catheter decompresses  the bladder. Prostate gland is not enlarged. Appendix is normal. No evidence of diverticulitis. No free or loculated pelvic fluid collections. There is a right perirectal fluid collection with peripheral enhancement consistent with abscess measuring 2.7 x 3.7 cm. This arises at the level of the distal rectum and appears to be above the levator muscles.  IMPRESSION: Right perirectal abscess measuring 2.7 x 3.7 cm.   Electronically Signed   By: Burman Nieves M.D.   On: 11/03/2013 04:10     Today   Subjective:   Corey Cross today has no headache,no chest abdominal pain,no new weakness tingling or numbness, no problems with urination overnight, feels much better wants to go home today.   Objective:   Blood pressure 133/74, pulse 64, temperature 98.5 F (36.9 C), temperature source Oral, resp. rate 18, height 5\' 10"  (1.778 m), weight 58.7 kg (129 lb 6.6 oz), SpO2 100.00%.  Intake/Output Summary (Last 24 hours) at 11/05/13 1111 Last data filed at 11/05/13 0938  Gross per 24 hour  Intake 2536.25 ml  Output   1975 ml  Net 561.25 ml    Exam Awake Alert, Oriented *3, No new F.N deficits, Normal affect Cylinder.AT,PERRAL Supple Neck,No JVD, No cervical lymphadenopathy appriciated.  Symmetrical Chest wall movement, Good air movement bilaterally, CTAB RRR,No Gallops,Rubs or new Murmurs, No Parasternal Heave +ve B.Sounds, Abd Soft, Non tender, No organomegaly appriciated, No rebound -guarding or rigidity. No Cyanosis, Clubbing or edema, No new Rash or bruise  Data Review   Cultures -  Results for orders placed during the hospital encounter of 11/02/13  SURGICAL PCR SCREEN     Status: None   Collection Time    11/03/13 12:42 PM      Result Value Ref Range Status   MRSA, PCR NEGATIVE  NEGATIVE Final   Staphylococcus aureus NEGATIVE  NEGATIVE Final   Comment:            The Xpert SA Assay (FDA     approved for NASAL specimens     in patients over 68 years of age),     is one component  of     a comprehensive surveillance     program.  Test performance has     been validated by [employer] for patients greater     than or equal to 107 year old.     It is not intended     to diagnose infection nor to     guide or monitor treatment.  GRAM STAIN     Status: None   Collection Time    11/03/13  2:24 PM      Result Value Ref Range Status   Specimen Description ABSCESS RECTAL ABSCESS   Final   Special Requests NONE   Final   Gram Stain     Final   Value: ABUNDANT WBC PRESENT, PREDOMINANTLY PMN     RARE WBC PRESENT, PREDOMINANTLY MONONUCLEAR     RARE GRAM NEGATIVE RODS     MODERATE GRAM POSITIVE COCCI IN PAIRS IN CLUSTERS     Gram Stain Report Called to,Read Back By and Verified WithKelli Churn RN [id number] 11/03/13 A NAVARRO   Report Status  11/03/2013 FINAL   Final  ANAEROBIC CULTURE     Status: None   Collection Time    11/03/13  2:30 PM      Result Value Ref Range Status   Specimen Description ABSCESS RECTAL ABSCESS   Final   Special Requests NONE   Final   Gram Stain PENDING   Incomplete   Culture     Final   Value: NO ANAEROBES ISOLATED; CULTURE IN PROGRESS FOR 5 DAYS     Performed at [facility]   Report Status PENDING   Incomplete  CULTURE, ROUTINE-ABSCESS     Status: None   Collection Time    11/03/13  2:30 PM      Result Value Ref Range Status   Specimen Description ABSCESS RECTAL ABSCESS   Final   Special Requests NONE   Final   Gram Stain     Final   Value: ABUNDANT WBC PRESENT,BOTH PMN AND MONONUCLEAR     RARE SQUAMOUS EPITHELIAL CELLS PRESENT     MODERATE GRAM POSITIVE COCCI     IN PAIRS IN CLUSTERS RARE GRAM NEGATIVE RODS     Performed by [facility]   Culture     Final   Value: NO GROWTH 1 DAY     Note: Gram Stain Report Called to,Read Back By and Verified WithKelli Churn RN [id number] 11/03/13 A NAVARRO     Performed at [facility]   Report Status PENDING   Incomplete     CBC w Diff: Lab Results  Component Value Date    WBC 5.8 11/05/2013   HGB 12.7* 11/05/2013   HCT 39.9 11/05/2013   PLT 183 11/05/2013   LYMPHOPCT 10* 11/03/2013   MONOPCT 8 11/03/2013   EOSPCT 0 11/03/2013   BASOPCT 0 11/03/2013   CMP: Lab Results  Component Value Date   NA 134* 11/05/2013   K 5.1 11/05/2013   CL 101 11/05/2013   CO2 21 11/05/2013   BUN 10 11/05/2013   CREATININE 0.84 11/05/2013  .  Micro Results Recent Results (from the past 240 hour(s))  SURGICAL PCR SCREEN     Status: None   Collection Time    11/03/13 12:42 PM      Result Value Ref Range Status   MRSA, PCR NEGATIVE  NEGATIVE Final   Staphylococcus aureus NEGATIVE  NEGATIVE Final   Comment:            The Xpert SA Assay (FDA     approved for NASAL specimens     in patients over 65 years of age),     is one component of     a comprehensive surveillance     program.  Test performance has     been validated by [employer] for patients greater     than or equal to 63 year old.     It is not intended     to diagnose infection nor to     guide or monitor treatment.  GRAM STAIN     Status: None   Collection Time    11/03/13  2:24 PM      Result Value Ref Range Status   Specimen Description ABSCESS RECTAL ABSCESS   Final   Special Requests NONE   Final   Gram Stain     Final   Value: ABUNDANT WBC PRESENT, PREDOMINANTLY PMN     RARE WBC PRESENT, PREDOMINANTLY MONONUCLEAR     RARE  GRAM NEGATIVE RODS     MODERATE GRAM POSITIVE COCCI IN PAIRS IN CLUSTERS     Gram Stain Report Called to,Read Back By and Verified With: S Cedric FishmanSHARPE RN [phone] 11/03/13 A NAVARRO   Report Status 11/03/2013 FINAL   Final  ANAEROBIC CULTURE     Status: None   Collection Time    11/03/13  2:30 PM      Result Value Ref Range Status   Specimen Description ABSCESS RECTAL ABSCESS   Final   Special Requests NONE   Final   Gram Stain PENDING   Incomplete   Culture     Final   Value: NO ANAEROBES ISOLATED; CULTURE IN PROGRESS FOR 5 DAYS     [facility]   Report Status  PENDING   Incomplete  CULTURE, ROUTINE-ABSCESS     Status: None   Collection Time    11/03/13  2:30 PM      Result Value Ref Range Status   Specimen Description ABSCESS RECTAL ABSCESS   Final   Special Requests NONE   Final   Gram Stain     Final   Value: ABUNDANT WBC PRESENT,BOTH PMN AND MONONUCLEAR     RARE SQUAMOUS EPITHELIAL CELLS PRESENT     MODERATE GRAM POSITIVE COCCI     IN PAIRS IN CLUSTERS RARE GRAM NEGATIVE RODS     [facility]   Culture     Final   Value: NO GROWTH 1 DAY     Note: Gram Stain Report Called to,Read Back By and Verified WithKelli Churn: S SHARPE RN [phone] 11/03/13 A NAVARRO     [facility]formed at [facility]   Report Status PENDING   Incomplete     Discharge Instructions     Follow urology within 2 weeks from discharge. Follow up with [facility] within 10 days from discharge. Continue take over-the-counter laxatives including Senokot one tablet 2 times a day, Colace 1 tablet 2 times a day, and MiraLAX as needed. Please follow surgery discharge instructions, guarding wound and sitz bath.     Follow-up Information   Follow up with Atilano InaWILSON,ERIC M, MD. Schedule an appointment as soon as possible for a visit on 11/05/2013. (our office will call you for an appointemnt )    Specialty:  General Surgery   Contact information:   [address] [place] [address] [phone]       Follow up with No PCP Per Patient.   Specialty:  General Practice      Discharge Medications     Medication List         amoxicillin-clavulanate 875-125 MG per tablet  Commonly known as:  AUGMENTIN  Take 1 tablet by mouth 2 (two) times daily.     DSS 100 MG Caps  Take 100 mg by mouth 2 (two) times daily.     ibuprofen 200 MG tablet  Commonly known as:  ADVIL,MOTRIN  Take 200 mg by mouth every 6 (six) hours as needed for moderate pain.     NORVIR 100 MG Tabs tablet  Generic drug:  ritonavir  Take 100 mg by mouth every morning.      PREZISTA 800 MG tablet  Generic drug:  Darunavir Ethanolate  Take 800 mg by mouth every morning.     senna 8.6 MG Tabs tablet  Commonly known as:  SENOKOT  Take 1 tablet (8.6 mg total) by mouth 2 (two) times daily.  tamsulosin 0.4 MG Caps capsule  Commonly known as:  FLOMAX  Take 1 capsule (0.4 mg total) by mouth daily after supper.     VIREAD 300 MG tablet  Generic drug:  tenofovir  Take 300 mg by mouth daily.     zidovudine 300 MG tablet  Commonly known as:  RETROVIR  Take 300 mg by mouth 2 (two) times daily.         Total Time in preparing paper work, data evaluation and todays exam - 35 minutes  Joron Velis M.D on 11/05/2013 at 11:11 AM  [facility] Office  [phone]

## 2013-11-07 LAB — CULTURE, ROUTINE-ABSCESS

## 2013-11-09 ENCOUNTER — Telehealth (INDEPENDENT_AMBULATORY_CARE_PROVIDER_SITE_OTHER): Payer: Self-pay

## 2013-11-09 NOTE — Telephone Encounter (Signed)
Attempted to contact pt with po appt.  Phone disconnected.  No emergency contact.  Message also in [employer].

## 2013-11-10 LAB — ANAEROBIC CULTURE

## 2014-02-24 ENCOUNTER — Emergency Department (HOSPITAL_BASED_OUTPATIENT_CLINIC_OR_DEPARTMENT_OTHER)
Admission: EM | Admit: 2014-02-24 | Discharge: 2014-02-25 | Disposition: A | Discharge status: Home / Self Care | Payer: Self-pay | Attending: Emergency Medicine | Admitting: Emergency Medicine

## 2014-02-24 ENCOUNTER — Emergency Department (HOSPITAL_BASED_OUTPATIENT_CLINIC_OR_DEPARTMENT_OTHER): Payer: Self-pay

## 2014-02-24 ENCOUNTER — Encounter (HOSPITAL_BASED_OUTPATIENT_CLINIC_OR_DEPARTMENT_OTHER): Payer: Self-pay | Admitting: *Deleted

## 2014-02-24 DIAGNOSIS — K611 Rectal abscess: Secondary | ICD-10-CM | POA: Insufficient documentation

## 2014-02-24 DIAGNOSIS — B20 Human immunodeficiency virus [HIV] disease: Secondary | ICD-10-CM | POA: Insufficient documentation

## 2014-02-24 DIAGNOSIS — Z79899 Other long term (current) drug therapy: Secondary | ICD-10-CM | POA: Insufficient documentation

## 2014-02-24 DIAGNOSIS — K6289 Other specified diseases of anus and rectum: Secondary | ICD-10-CM

## 2014-02-24 DIAGNOSIS — Z8619 Personal history of other infectious and parasitic diseases: Secondary | ICD-10-CM | POA: Insufficient documentation

## 2014-02-24 LAB — CBC
HCT: 42.5 % (ref 39.0–52.0)
Hemoglobin: 13.5 g/dL (ref 13.0–17.0)
MCH: 29.4 pg (ref 26.0–34.0)
MCHC: 31.8 g/dL (ref 30.0–36.0)
MCV: 92.6 fL (ref 78.0–100.0)
Platelets: 191 10*3/uL (ref 150–400)
RBC: 4.59 MIL/uL (ref 4.22–5.81)
RDW: 13.9 % (ref 11.5–15.5)
WBC: 8.1 10*3/uL (ref 4.0–10.5)

## 2014-02-24 LAB — BASIC METABOLIC PANEL
Anion gap: 3 — ABNORMAL LOW (ref 5–15)
BUN: 11 mg/dL (ref 6–23)
CALCIUM: 9 mg/dL (ref 8.4–10.5)
CO2: 27 mmol/L (ref 19–32)
Chloride: 105 mEq/L (ref 96–112)
Creatinine, Ser: 0.81 mg/dL (ref 0.50–1.35)
GFR calc Af Amer: >90 mL/min (ref >90)
GFR calc non Af Amer: >90 mL/min (ref >90)
Glucose, Bld: 97 mg/dL (ref 70–99)
Potassium: 3.7 mmol/L (ref 3.5–5.1)
Sodium: 135 mmol/L (ref 135–145)

## 2014-02-24 MED ORDER — IOHEXOL 300 MG/ML  SOLN
25.0000 mL | Freq: Once | INTRAMUSCULAR | Status: AC | PRN
  Administered 2014-02-24: 25 mL via ORAL

## 2014-02-24 MED ORDER — IOHEXOL 300 MG/ML  SOLN
100.0000 mL | Freq: Once | INTRAMUSCULAR | Status: AC | PRN
  Administered 2014-02-24: 100 mL via INTRAVENOUS

## 2014-02-24 NOTE — ED Provider Notes (Signed)
CSN: [id number]     Arrival date & time 02/24/14  2026 History  This chart was scribed for Corey SouSam Tarissa Kerin, MD by Roxy Cedarhandni Bhalodia, ED Scribe. This patient was seen in room MH09/MH09 and the patient's care was started at 10:10 PM.   Chief Complaint  Patient presents with  . Abscess   Patient is a 24 y.o. male presenting with abscess. The history is provided by the patient. No language interpreter was used.  Abscess Location:  Ano-genital Ano-genital abscess location:  Gluteal cleft Progression:  Worsening Chronicity:  New Relieved by:  Nothing Worsened by:  Nothing tried Risk factors: prior abscess     HPI Comments: Iline OvenGerald Cross is a 24 y.o. male, with a PMHx of being HIV positive, who presents to the Emergency Department complaining of abscess to rectal area that began 2 days ago. Patient states that he has history of similar abscess that was drained in October. He denies associated fever. Patient states that he currently takes his HIV medications. Patient's last CD4 count was 200 September 2015. No pain with bowel movements no fever. Nothing makes pain better or worse. Pain is mild at present. No abdominal pain no other associated symptomsPatient does not smoke. Patient drinks socially. Patient denies use of recreational drugs.   Past Medical History  Diagnosis Date  . HIV positive   . AIDS   . Anal condylomata   . Rectal gonorrhea    Past Surgical History  Procedure Laterality Date  . Warts removed    . Incision and drainage perirectal abscess N/A 11/03/2013    Procedure: IRRIGATION AND DEBRIDEMENT PERIRECTAL ABSCESS;  Surgeon: Atilano InaEric M Wilson, MD;  Location: [facility];  Service: General;  Laterality: N/A;   History reviewed. No pertinent family history. History  Substance Use Topics  . Smoking status: Never Smoker   . Smokeless tobacco: Never Used  . Alcohol Use: Yes   Review of Systems  Constitutional: Negative.   HENT: Negative.   Respiratory: Negative.   Cardiovascular:  Negative.   Gastrointestinal: Positive for rectal pain.  Musculoskeletal: Negative.   Skin: Positive for wound.  Neurological: Negative.   Psychiatric/Behavioral: Negative.   All other systems reviewed and are negative.  Allergies  Review of patient's allergies indicates no known allergies.  Home Medications   Prior to Admission medications   Medication Sig Start Date End Date Taking? Authorizing Provider  Darunavir Ethanolate (PREZISTA) 800 MG tablet Take 800 mg by mouth every morning. 09/03/13   Historical Provider, MD  docusate sodium 100 MG CAPS Take 100 mg by mouth 2 (two) times daily. 11/05/13   Dawood Elgergawy, MD  ibuprofen (ADVIL,MOTRIN) 200 MG tablet Take 200 mg by mouth every 6 (six) hours as needed for moderate pain.    Historical Provider, MD  ritonavir (NORVIR) 100 MG TABS tablet Take 100 mg by mouth every morning. 09/03/13   Historical Provider, MD  senna (SENOKOT) 8.6 MG TABS tablet Take 1 tablet (8.6 mg total) by mouth 2 (two) times daily. 11/05/13   Huey Bienenstockawood Elgergawy, MD  tamsulosin (FLOMAX) 0.4 MG CAPS capsule Take 1 capsule (0.4 mg total) by mouth daily after supper. 11/05/13   Huey Bienenstockawood Elgergawy, MD  tenofovir (VIREAD) 300 MG tablet Take 300 mg by mouth daily. 09/03/13   Historical Provider, MD  zidovudine (RETROVIR) 300 MG tablet Take 300 mg by mouth 2 (two) times daily. 09/03/13   Historical Provider, MD   Triage Vitals: BP 120/72 mmHg  Pulse 80  Temp(Src) 97.8 F (36.6 C)  Resp 18  Ht  (1.778 m)  Wt 135 lb (61.236 kg)  BMI 19.37 kg/m2  SpO2 100%  Physical Exam  Constitutional: He appears well-developed and well-nourished.  HENT:  Head: Normocephalic and atraumatic.  Eyes: Conjunctivae are normal. Pupils are equal, round, and reactive to light.  Neck: Neck supple. No tracheal deviation present. No thyromegaly present.  Cardiovascular: Normal rate and regular rhythm.   No murmur heard. Pulmonary/Chest: Effort normal and breath sounds normal.  Abdominal:  Soft. Bowel sounds are normal. He exhibits no distension. There is no tenderness.  Genitourinary: Rectum normal, prostate normal and penis normal.  Mild tenderness on rectal exam. No fissure. No hemorrhoid. No. perianal abscess  Musculoskeletal: Normal range of motion. He exhibits no edema or tenderness.  Neurological: He is alert. Coordination normal.  Skin: Skin is warm and dry. No rash noted.  Psychiatric: He has a normal mood and affect.  Nursing note and vitals reviewed.  ED Course  Procedures (including critical care time)  DIAGNOSTIC STUDIES: Oxygen Saturation is 100% on RA, normal by my interpretation.    COORDINATION OF CARE: 10:12 PM- Pt advised of plan for treatment and pt agrees.  Labs Review Labs Reviewed - No data to display  Imaging Review No results found.   EKG Interpretation None     patient declines pain medicine 12:05 AM patient resting comfortably Results for orders placed or performed during the hospital encounter of 02/24/14  CBC  Result Value Ref Range   WBC 8.1 4.0 - 10.5 K/uL   RBC 4.59 4.22 - 5.81 MIL/uL   Hemoglobin 13.5 13.0 - 17.0 g/dL   HCT 16.1 09.6 - 04.5 %   MCV 92.6 78.0 - 100.0 fL   MCH 29.4 26.0 - 34.0 pg   MCHC 31.8 30.0 - 36.0 g/dL   RDW 40.9 81.1 - 91.4 %   Platelets 191 150 - 400 K/uL  Basic metabolic panel  Result Value Ref Range   Sodium 135 135 - 145 mmol/L   Potassium 3.7 3.5 - 5.1 mmol/L   Chloride 105 96 - 112 mEq/L   CO2 27 19 - 32 mmol/L   Glucose, Bld 97 70 - 99 mg/dL   BUN 11 6 - 23 mg/dL   Creatinine, Ser 7.82 0.50 - 1.35 mg/dL   Calcium 9.0 8.4 - 95.6 mg/dL   GFR calc non Af Amer >90 >90 mL/min   GFR calc Af Amer >90 >90 mL/min   Anion gap 3 (L) 5 - 15   Ct Abdomen Pelvis W Contrast  02/24/2014   CLINICAL DATA:  Rectal pain, similar to rectal abscess 3 months ago.  EXAM: CT ABDOMEN AND PELVIS WITH CONTRAST  TECHNIQUE: Multidetector CT imaging of the abdomen and pelvis was performed using the standard protocol  following bolus administration of intravenous contrast.  CONTRAST:  25mL OMNIPAQUE IOHEXOL 300 MG/ML SOLN, OMNIPAQUE IOHEXOL 300 MG/ML SOLN  COMPARISON:  11/03/2013  FINDINGS: BODY WALL: Unremarkable.  LOWER CHEST: Unremarkable.  ABDOMEN/PELVIS:  Liver: No focal abnormality.  Biliary: No evidence of biliary obstruction or stone.  Pancreas: Unremarkable.  Spleen: Unremarkable.  Adrenals: Unremarkable.  Kidneys and ureters: No hydronephrosis or stone.  Bladder: Unremarkable.  Reproductive: Unremarkable.  Bowel: Proctitis persists with wall thickening and mesorectal fat haziness. Small ascites in the lower abdomen is likely reactive. Interval decrease in perirectal abscess at the 8 o'clock position, now 9 mm in maximal diameter. Negative appendix.  Retroperitoneum: No mass or adenopathy.  Vascular: No acute abnormality.  OSSEOUS: No acute abnormalities.  IMPRESSION: 1. Persistent but smaller right perirectal abscess, now 9 mm. 2. Proctitis.   Electronically Signed   By: Tiburcio Pea M.D.   On: 02/24/2014 23:20    MDM   Assessment: patient's pain is minimal. Well-controlled. He does not want surgery. His perirectal abscess is much smaller than prior episode September 2015 and he is not ill-appearing I spoke with Dr.Rosenbower plan prescription Augmentin [phone], Flagyl, patient instructed to call [facility] [place] [facility] on Monday, 02/28/2014 to schedule next available appointment with Dr. Andrey Campanile Diagnosis #1perirectal abscess #2 proctitis Final diagnoses:  None      I personally performed the services described in this documentation, which was scribed in my presence. The recorded information has been reviewed and is accurate.  Corey Sou, MD 02/25/14 1610

## 2014-02-24 NOTE — ED Notes (Signed)
Pt c/o abscess to rectal area x 2 days

## 2014-02-25 MED ORDER — METRONIDAZOLE 500 MG PO TABS: 500.0000 mg | ORAL_TABLET | Freq: Three times a day (TID) | ORAL | Status: DC

## 2014-02-25 MED ORDER — AMOXICILLIN-POT CLAVULANATE 875-125 MG PO TABS: 1.0000 | ORAL_TABLET | Freq: Two times a day (BID) | ORAL | Status: DC

## 2014-02-25 MED ORDER — METRONIDAZOLE 500 MG PO TABS
500.0000 mg | ORAL_TABLET | Freq: Once | ORAL | Status: AC
  Administered 2014-02-25: 500 mg via ORAL
  Filled 2014-02-25: qty 1

## 2014-02-25 MED ORDER — AMOXICILLIN-POT CLAVULANATE 875-125 MG PO TABS
1.0000 | ORAL_TABLET | Freq: Once | ORAL | Status: AC
  Administered 2014-02-25: 1 via ORAL
  Filled 2014-02-25: qty 1

## 2014-02-25 NOTE — Discharge Instructions (Signed)
Your CT scan tonight shows that you have a perirectal abscess, though much smaller than the one that you had in September 2015. It also shows that you have an infection of your rectum. [facility] to schedule the next available appointment with Dr. Andrey CampanileWilson. Take Tylenol or Advil as directed for pain.take the antibiotics as prescribed

## 2016-04-14 ENCOUNTER — Emergency Department (HOSPITAL_COMMUNITY): Payer: 59

## 2016-04-14 ENCOUNTER — Encounter (HOSPITAL_COMMUNITY): Payer: Self-pay | Admitting: Emergency Medicine

## 2016-04-14 ENCOUNTER — Emergency Department (HOSPITAL_COMMUNITY)
Admission: EM | Admit: 2016-04-14 | Discharge: 2016-04-16 | Disposition: A | Discharge status: Home / Self Care | Payer: 59 | Attending: Emergency Medicine | Admitting: Emergency Medicine

## 2016-04-14 DIAGNOSIS — F10129 Alcohol abuse with intoxication, unspecified: Secondary | ICD-10-CM | POA: Diagnosis present

## 2016-04-14 DIAGNOSIS — W1839XA Other fall on same level, initial encounter: Secondary | ICD-10-CM | POA: Insufficient documentation

## 2016-04-14 DIAGNOSIS — Y9301 Activity, walking, marching and hiking: Secondary | ICD-10-CM | POA: Diagnosis not present

## 2016-04-14 DIAGNOSIS — R400 Somnolence: Secondary | ICD-10-CM | POA: Diagnosis not present

## 2016-04-14 DIAGNOSIS — Z79899 Other long term (current) drug therapy: Secondary | ICD-10-CM | POA: Diagnosis not present

## 2016-04-14 DIAGNOSIS — Y92009 Unspecified place in unspecified non-institutional (private) residence as the place of occurrence of the external cause: Secondary | ICD-10-CM | POA: Insufficient documentation

## 2016-04-14 DIAGNOSIS — T402X2A Poisoning by other opioids, intentional self-harm, initial encounter: Secondary | ICD-10-CM | POA: Diagnosis not present

## 2016-04-14 DIAGNOSIS — R45851 Suicidal ideations: Secondary | ICD-10-CM

## 2016-04-14 DIAGNOSIS — R51 Headache: Secondary | ICD-10-CM | POA: Insufficient documentation

## 2016-04-14 DIAGNOSIS — Y999 Unspecified external cause status: Secondary | ICD-10-CM | POA: Diagnosis not present

## 2016-04-14 DIAGNOSIS — T50902A Poisoning by unspecified drugs, medicaments and biological substances, intentional self-harm, initial encounter: Secondary | ICD-10-CM

## 2016-04-14 LAB — CBC
HCT: 36.3 % — ABNORMAL LOW (ref 39.0–52.0)
HEMOGLOBIN: 11.5 g/dL — AB (ref 13.0–17.0)
MCH: 28.2 pg (ref 26.0–34.0)
MCHC: 31.7 g/dL (ref 30.0–36.0)
MCV: 89 fL (ref 78.0–100.0)
Platelets: 189 10*3/uL (ref 150–400)
RBC: 4.08 MIL/uL — AB (ref 4.22–5.81)
RDW: 12.8 % (ref 11.5–15.5)
WBC: 3.9 10*3/uL — AB (ref 4.0–10.5)

## 2016-04-14 LAB — RAPID URINE DRUG SCREEN, HOSP PERFORMED
AMPHETAMINES: NOT DETECTED
BENZODIAZEPINES: NOT DETECTED
Barbiturates: NOT DETECTED
Cocaine: NOT DETECTED
Opiates: POSITIVE — AB
TETRAHYDROCANNABINOL: NOT DETECTED

## 2016-04-14 LAB — COMPREHENSIVE METABOLIC PANEL
ALBUMIN: 3.1 g/dL — AB (ref 3.5–5.0)
ALT: 9 U/L — AB (ref 17–63)
AST: 15 U/L (ref 15–41)
Alkaline Phosphatase: 38 U/L (ref 38–126)
Anion gap: 7 (ref 5–15)
BUN: 7 mg/dL (ref 6–20)
CHLORIDE: 110 mmol/L (ref 101–111)
CO2: 24 mmol/L (ref 22–32)
CREATININE: 0.83 mg/dL (ref 0.61–1.24)
Calcium: 8 mg/dL — ABNORMAL LOW (ref 8.9–10.3)
GFR calc Af Amer: >60 mL/min (ref >60)
GFR calc non Af Amer: >60 mL/min (ref >60)
GLUCOSE: 82 mg/dL (ref 65–99)
Potassium: 3.5 mmol/L (ref 3.5–5.1)
SODIUM: 141 mmol/L (ref 135–145)
Total Bilirubin: 0.5 mg/dL (ref 0.3–1.2)
Total Protein: 6.2 g/dL — ABNORMAL LOW (ref 6.5–8.1)

## 2016-04-14 LAB — ETHANOL: Alcohol, Ethyl (B): 92 mg/dL — ABNORMAL HIGH (ref <5)

## 2016-04-14 LAB — SALICYLATE LEVEL: Salicylate Lvl: <7 mg/dL (ref 2.8–30.0)

## 2016-04-14 LAB — ACETAMINOPHEN LEVEL: Acetaminophen (Tylenol), Serum: <10 ug/mL — ABNORMAL LOW (ref 10–30)

## 2016-04-14 MED ORDER — SODIUM CHLORIDE 0.9 % IV BOLUS (SEPSIS)
1000.0000 mL | Freq: Once | INTRAVENOUS | Status: AC
  Administered 2016-04-14: 1000 mL via INTRAVENOUS

## 2016-04-14 MED ORDER — SENNA 8.6 MG PO TABS
1.0000 | ORAL_TABLET | Freq: Two times a day (BID) | ORAL | Status: DC
  Administered 2016-04-16: 8.6 mg via ORAL
  Filled 2016-04-14 (×6): qty 1

## 2016-04-14 NOTE — ED Notes (Signed)
Pt signed Medical Clearance Pt Policy form - copy given to pt and copy placed on clipboard. Pt voiced understanding of policy. Pt wearing burgundy scrubs. Pt's belongings inventoried - 1 labeled belongings bag placed in locked lower cabinet and valuables envelope given to security. Pt signed - verifying NO MONEY OR CELL PHONE or shoes. Offered pt snack - declined at this time. States just wants to sleep. Pt's [facility] - was advised pt requested for him to return to visit later d/t pt tired. [facility] and pt ready to perform.

## 2016-04-14 NOTE — ED Triage Notes (Addendum)
Brought by EMS from home.  Room mate called stating patient had taken an overdose.  Per ems patient took benadryl 250mg , Hydrocodone-unknown amount.  Pt reports maybe 10.  Also reports taking a whole bottle of some kind of muscle relaxer.  Tearful in triage.  Reports trying to kill himself.  Per ems patient was found on the ground.  Placed in C-collar.  Removed by patient on arrival to the room.  Pt does admit falling while walking.

## 2016-04-14 NOTE — ED Notes (Signed)
Patient asleep.

## 2016-04-14 NOTE — ED Notes (Signed)
Pt awake asking for phone call, pt currently on the phone.

## 2016-04-14 NOTE — ED Notes (Addendum)
Pt woke briefly to eat dinner then returned to sleeping. Pt's sister called asking to speak w/pt - advised her he is sleeping and staff will advise him to call her when he awakens. She advised she will call back in am.

## 2016-04-14 NOTE — ED Notes (Signed)
Patient sleeping

## 2016-04-14 NOTE — [facility] Counselor (Signed)
Attempted assessment.  Was advised that Pt is too drowsy for assessment.  Staff nurse recommended re-assessment in about an hour.

## 2016-04-14 NOTE — ED Provider Notes (Signed)
[facility]-EMERGENCY DEPT Provider Note   CSN: [id number] Arrival date & time: 04/14/16  4098     History   Chief Complaint Chief Complaint  Patient presents with  . Drug Overdose  . Suicide Attempt    HPI Corey Cross is a 26 y.o. male.  HPI  This is a 26 year old male with a history of HIV and AIDS who presents after an ingestion. Patient reports ingesting an unknown amount of hydrocodone as well as Benadryl in an attempt to hurt himself. He reports that "maybe I took 10." He also reports taking a muscle relaxer. He is very somnolent on my exam but does arouse and answer some questions. Also admits to a fall. Reports alcohol use earlier today.  Limited history secondary to patient's somnolence.  Past Medical History:  Diagnosis Date  . AIDS (HCC)   . Anal condylomata   . HIV positive (HCC)   . Rectal gonorrhea     Patient Active Problem List   Diagnosis Date Noted  . Rectal abscess 11/03/2013  . Urinary retention 11/03/2013  . HIV disease (HCC) 11/03/2013    Past Surgical History:  Procedure Laterality Date  . INCISION AND DRAINAGE PERIRECTAL ABSCESS N/A 11/03/2013   Procedure: IRRIGATION AND DEBRIDEMENT PERIRECTAL ABSCESS;  Surgeon: Atilano Ina, MD;  Location: [facility];  Service: General;  Laterality: N/A;  . WARTS REMOVED         Home Medications    Prior to Admission medications   Medication Sig Start Date End Date Taking? Authorizing Provider  amoxicillin-clavulanate (AUGMENTIN) 875-125 MG per tablet Take 1 tablet by mouth 2 (two) times daily. One po bid x 14 days 02/25/14   Doug Sou, MD  Darunavir Ethanolate (PREZISTA) 800 MG tablet Take 800 mg by mouth every morning. 09/03/13   Historical Provider, MD  docusate sodium 100 MG CAPS Take 100 mg by mouth 2 (two) times daily. 11/05/13   Leana Roe Elgergawy, MD  ibuprofen (ADVIL,MOTRIN) 200 MG tablet Take 200 mg by mouth every 6 (six) hours as needed for moderate pain.    Historical Provider, MD    metroNIDAZOLE (FLAGYL) 500 MG tablet Take 1 tablet (500 mg total) by mouth 3 (three) times daily. 02/25/14   Doug Sou, MD  ritonavir (NORVIR) 100 MG TABS tablet Take 100 mg by mouth every morning. 09/03/13   Historical Provider, MD  senna (SENOKOT) 8.6 MG TABS tablet Take 1 tablet (8.6 mg total) by mouth 2 (two) times daily. 11/05/13   Leana Roe Elgergawy, MD  tamsulosin (FLOMAX) 0.4 MG CAPS capsule Take 1 capsule (0.4 mg total) by mouth daily after supper. 11/05/13   Leana Roe Elgergawy, MD  tenofovir (VIREAD) 300 MG tablet Take 300 mg by mouth daily. 09/03/13   Historical Provider, MD  zidovudine (RETROVIR) 300 MG tablet Take 300 mg by mouth 2 (two) times daily. 09/03/13   Historical Provider, MD    Family History No family history on file.  Social History Social History  Substance Use Topics  . Smoking status: Never Smoker  . Smokeless tobacco: Never Used  . Alcohol use Yes     Allergies   Patient has no known allergies.   Review of Systems Review of Systems  Respiratory: Negative for shortness of breath.   Cardiovascular: Negative for chest pain.  Gastrointestinal: Negative for abdominal pain.  Psychiatric/Behavioral: Positive for self-injury and suicidal ideas.  All other systems reviewed and are negative.    Physical Exam Updated Vital Signs BP 97/65   Pulse  69   Temp 98.4 F (36.9 C) (Oral)   Resp 13   Ht 5\' 10"  (1.778 m)   Wt 130 lb (59 kg)   SpO2 96%   BMI 18.65 kg/m   Physical Exam  Constitutional: He is oriented to person, place, and time.  Somnolent but arousable and oriented  HENT:  Head: Normocephalic and atraumatic.  Eyes: Pupils are equal, round, and reactive to light.  Pupils 5 mm reactive bilaterally, injected conjunctiva  Neck: Neck supple.  Cardiovascular: Normal rate, regular rhythm, normal heart sounds and intact distal pulses.   No murmur heard. Pulmonary/Chest: Effort normal and breath sounds normal. No respiratory distress. He has no  wheezes.  Abdominal: Soft. Bowel sounds are normal. There is no tenderness. There is no rebound.  Musculoskeletal: He exhibits no edema.  Neurological: He is oriented to person, place, and time.  Somnolent but arousable, follows commands, moves all 4 extremities  Skin: Skin is warm and dry.  Psychiatric:  Flat affect  Nursing note and vitals reviewed.    ED Treatments / Results  Labs (all labs ordered are listed, but only abnormal results are displayed) Labs Reviewed  COMPREHENSIVE METABOLIC PANEL - Abnormal; Notable for the following:       Result Value   Calcium 8.0 (*)    Total Protein 6.2 (*)    Albumin 3.1 (*)    ALT 9 (*)    All other components within normal limits  ETHANOL - Abnormal; Notable for the following:    Alcohol, Ethyl (B) 92 (*)    All other components within normal limits  ACETAMINOPHEN LEVEL - Abnormal; Notable for the following:    Acetaminophen (Tylenol), Serum <10 (*)    All other components within normal limits  CBC - Abnormal; Notable for the following:    WBC 3.9 (*)    RBC 4.08 (*)    Hemoglobin 11.5 (*)    HCT 36.3 (*)    All other components within normal limits  RAPID URINE DRUG SCREEN, HOSP PERFORMED - Abnormal; Notable for the following:    Opiates POSITIVE (*)    All other components within normal limits  SALICYLATE LEVEL  ACETAMINOPHEN LEVEL    EKG  EKG Interpretation  Date/Time:  Sunday April 14 2016 04:11:11 EDT Ventricular Rate:  66 PR Interval:    QRS Duration: 89 QT Interval:  436 QTC Calculation: 457 R Axis:   88 Text Interpretation:  Sinus rhythm Confirmed by Melesio Madara  MD, Winefred Hillesheim ([id number]) on 04/14/2016 6:52:57 AM       Radiology Ct Head Wo Contrast  Result Date: 04/14/2016 CLINICAL DATA:  Pain after fall EXAM: CT HEAD WITHOUT CONTRAST CT CERVICAL SPINE WITHOUT CONTRAST TECHNIQUE: Multidetector CT imaging of the head and cervical spine was performed following the standard protocol without intravenous contrast.  Multiplanar CT image reconstructions of the cervical spine were also generated. COMPARISON:  None. FINDINGS: CT HEAD FINDINGS BRAIN: The ventricles and sulci are normal. No intraparenchymal hemorrhage, mass effect nor midline shift. No acute large vascular territory infarcts. No abnormal extra-axial fluid collections. Basal cisterns are midline and not effaced. No acute cerebellar abnormality. VASCULAR: Unremarkable. SKULL/SOFT TISSUES: No skull fracture. No significant soft tissue swelling. ORBITS/SINUSES: The included ocular globes and orbital contents are normal.The mastoid air-cells and included paranasal sinuses are well-aerated. OTHER: None. CT CERVICAL SPINE FINDINGS ALIGNMENT: Vertebral bodies in alignment. Slight reversal cervical lordosis which reflect positioning or muscle spasm. SKULL BASE AND VERTEBRAE: Cervical vertebral bodies and posterior elements are  intact. Intervertebral disc heights preserved. No destructive bony lesions. C1-2 articulation maintained. SOFT TISSUES AND SPINAL CANAL: Normal. DISC LEVELS: No significant osseous canal stenosis or neural foraminal narrowing. UPPER CHEST: Lung apices are clear. OTHER: None. IMPRESSION: No acute intracranial abnormality. No acute posttraumatic cervical spine fracture or subluxation. Electronically Signed   By: Tollie Ethavid  Kwon M.D.   On: 04/14/2016 05:21   Ct Cervical Spine Wo Contrast  Result Date: 04/14/2016 CLINICAL DATA:  Pain after fall EXAM: CT HEAD WITHOUT CONTRAST CT CERVICAL SPINE WITHOUT CONTRAST TECHNIQUE: Multidetector CT imaging of the head and cervical spine was performed following the standard protocol without intravenous contrast. Multiplanar CT image reconstructions of the cervical spine were also generated. COMPARISON:  None. FINDINGS: CT HEAD FINDINGS BRAIN: The ventricles and sulci are normal. No intraparenchymal hemorrhage, mass effect nor midline shift. No acute large vascular territory infarcts. No abnormal extra-axial fluid  collections. Basal cisterns are midline and not effaced. No acute cerebellar abnormality. VASCULAR: Unremarkable. SKULL/SOFT TISSUES: No skull fracture. No significant soft tissue swelling. ORBITS/SINUSES: The included ocular globes and orbital contents are normal.The mastoid air-cells and included paranasal sinuses are well-aerated. OTHER: None. CT CERVICAL SPINE FINDINGS ALIGNMENT: Vertebral bodies in alignment. Slight reversal cervical lordosis which reflect positioning or muscle spasm. SKULL BASE AND VERTEBRAE: Cervical vertebral bodies and posterior elements are intact. Intervertebral disc heights preserved. No destructive bony lesions. C1-2 articulation maintained. SOFT TISSUES AND SPINAL CANAL: Normal. DISC LEVELS: No significant osseous canal stenosis or neural foraminal narrowing. UPPER CHEST: Lung apices are clear. OTHER: None. IMPRESSION: No acute intracranial abnormality. No acute posttraumatic cervical spine fracture or subluxation. Electronically Signed   By: Tollie Ethavid  Kwon M.D.   On: 04/14/2016 05:21    Procedures Procedures (including critical care time)  Medications Ordered in ED Medications  sodium chloride 0.9 % bolus 1,000 mL (0 mLs Intravenous Stopped 04/14/16 0654)     Initial Impression / Assessment and Plan / ED Course  I have reviewed the triage vital signs and the nursing notes.  Pertinent labs & imaging results that were available during my care of the patient were reviewed by me and considered in my medical decision making (see chart for details).     Patient presents after reported intentional ingestion. He is somnolent but arousable. Pupils are not pinpoint. Suspect polypharmacy with EtOH on board. EKG is reassuring. Notable for an alcohol level of 92. He is positive for opiates. Tylenol level initially negative. Repeat done at 4 hours. This is pending. However, CT is consulted given suicidal gesture. Patient is medically clear pending Tylenol level.  Final Clinical  Impressions(s) / ED Diagnoses   Final diagnoses:  Suicidal ideation  Intentional drug overdose, initial encounter [facility](HCC)    New Prescriptions New Prescriptions   No medications on file     Shon Batonourtney F Karter Haire, MD 04/14/16 [phone]

## 2016-04-14 NOTE — ED Notes (Signed)
Per Secretary 1st, pt's father here to visit. Attempted to arouse pt to advise d/t pt sleeping. Advised father pt is sleeping and he may visit w/him briefly as he was not aware of visitation times or he can wait until am. Father advised he will wait until tomorrow.

## 2016-04-14 NOTE — ED Notes (Signed)
TTS attempted to speak to pt, however, pt stated too tired to speak at this time.  TTS to call back in 1 hour.

## 2016-04-14 NOTE — ED Notes (Signed)
Pt reports to Pharmacy tech that he has been noncompliant with antiviral medications. MD made aware.

## 2016-04-14 NOTE — ED Provider Notes (Signed)
Care assumed from previous provider, Dr. Wilkie AyeHorton. Please see note for further details. Case discussed, plan agreed upon. Briefly, patient is a 26 y.o. male who presented to ED after reported intentional ingestion. Tylenol level negative on initial labwork. 4 hour Tylenol level pending. Will follow up on repeat APAP level. If negative, disposition per TTS.   Repeat Tylenol negative. Disposition per TTS recommendations.    [facility] Surgery Center LtdJaime Pilcher Ward, PA-C 04/14/16 1032    Tilden FossaElizabeth Rees, MD 04/20/16 1455

## 2016-04-14 NOTE — ED Notes (Signed)
TTS being performed.  
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
## 2016-04-14 NOTE — ED Notes (Signed)
Taken to CT at this time. 

## 2016-04-15 NOTE — ED Notes (Signed)
Patient showered, changed clothing (Scrubs).

## 2016-04-15 NOTE — ED Notes (Signed)
Patient had one phone call.

## 2016-04-15 NOTE — ED Notes (Signed)
Regular diet was ordered for breakfast. 

## 2016-04-15 NOTE — ED Notes (Signed)
Pt stating that he wants to leave. Pt instructed that right now he has met inpatient criteria and BH are looking for placement. Pt states that he would like to speak with [facility] today to see if he

## 2016-04-15 NOTE — ED Triage Notes (Signed)
PT received telephone call from out side hospital.

## 2016-04-15 NOTE — Progress Notes (Addendum)
[facility] called stating she had questions about pt's behavior in ED and medical questions. [facility]. Corey Cross states she will call back once more information is known if [facility] able to [facility].  Ilean SkillMeghan Merriel Zinger, MSW, LCSW Clinical Social Work, Disposition  04/15/2016 [phone]  RN Magda PaganiniAudrey spoke with [facility] as they are not able to provide HIV medications.

## 2016-04-15 NOTE — BH Assessment (Signed)
[facility] Assessment Progress Note Reassessment-- Pt is oriented x 4, states that his overdose was an "accident", he was trying to get some sleep and not attempt suicide, he states. He says he has always had trouble sleeping. He denies feeling depressed, SI, HI, AVH. Pt wants to go home. Pt cooperative with therapist. Pt's affect appears appropriate to situation, and affect is congruent to mood.  TTS to continue seeking placement with possible re-eval in am.

## 2016-04-15 NOTE — ED Notes (Signed)
Patient asleep.

## 2016-04-15 NOTE — ED Triage Notes (Signed)
Father called pt , Pt reminded  This was his second telephone call for day.

## 2016-04-15 NOTE — Progress Notes (Signed)
Pt received TTS assessment 3/11 and inpatient treatment was recommended.  [facility], considered for admission upon bed availability, however, [facility]  [facility] the [facility]  Ilean SkillMeghan Jabril Pursell, MSW, LCSW Clinical Social Work, Disposition  04/15/2016 [phone]

## 2016-04-16 DIAGNOSIS — Z79899 Other long term (current) drug therapy: Secondary | ICD-10-CM | POA: Diagnosis not present

## 2016-04-16 DIAGNOSIS — F10129 Alcohol abuse with intoxication, unspecified: Secondary | ICD-10-CM | POA: Diagnosis not present

## 2016-04-16 MED ORDER — ELVITEG-COBIC-EMTRICIT-TENOFAF 150-150-200-10 MG PO TABS: 1.0000 | ORAL_TABLET | Freq: Every day | ORAL | Status: DC

## 2016-04-16 MED ORDER — DARUNAVIR ETHANOLATE 800 MG PO TABS: 800.0000 mg | ORAL_TABLET | Freq: Every day | ORAL | Status: DC

## 2016-04-16 MED ORDER — SPIRONOLACTONE 25 MG PO TABS
50.0000 mg | ORAL_TABLET | Freq: Two times a day (BID) | ORAL | Status: DC
  Administered 2016-04-16: 50 mg via ORAL
  Filled 2016-04-16: qty 2

## 2016-04-16 NOTE — ED Notes (Signed)
Laurita Quint Winthrow, FNP, in w/pt.

## 2016-04-16 NOTE — ED Notes (Signed)
Snack given. P'st ride called stating he was here to pick up Corey Cross

## 2016-04-16 NOTE — ED Notes (Signed)
Pt on phone w/roommate at nurses' desk.

## 2016-04-16 NOTE — ED Notes (Signed)
Pt voiced understanding and agreement w/tx plan - d/c to home. Pt verbalized agreement and signed "No Harm Contract" - copy given to pt and copy sent to Medical Records.

## 2016-04-16 NOTE — ED Notes (Signed)
Pt to wait in room for ride to arrive.

## 2016-04-16 NOTE — ED Notes (Signed)
Snack given. Lunch order taken. 

## 2016-04-16 NOTE — Consult Note (Signed)
[facility]   Reason for Consult:  Possible overdose while intoxicated, undetermined intent Referring Physician:  EDP Patient Identification: Corey Cross MRN:  [id number] Principal Diagnosis: Alcohol abuse with intoxication [facility](HCC) Diagnosis:   Patient Active Problem List   Diagnosis Date Noted  . Alcohol abuse with intoxication (HCC) [F10.129] 04/16/2016    Priority: High  . Rectal abscess [K61.1] 11/03/2013  . Urinary retention [R33.9] 11/03/2013  . HIV disease (HCC) [B20] 11/03/2013    Total Time spent with patient: 30 minutes  Subjective:   Corey OvenGerald Podgurski is a 26 y.o. male patient admitted with reports of possible overdose although pt was intoxicated at time of report and intent is unknown. Pt seen and chart reviewed. Pt is alert/oriented x4, calm, cooperative, and appropriate to situation. Pt denies suicidal/homicidal ideation and psychosis and does not appear to be responding to internal stimuli. Pt reports that he had too much to drink after going out with his sister and some friends. Pt reports that he has had some pain in his lower back for which he has been on Flexeril for 2 years. 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 He denies depression aside from moving out of his boyfriend's house this week. Pt was intoxicated at time of arrival to hospital which may have impaired his judgment. Pt has been in the hospital for approximately 2.5 days.   HPI:  I have reviewed and concur with HPI elements below, modified as follows: "Corey OvenGerald Edberg is a 26 [facility].o. [facility] intentional overdose of  unspecified amounts of hydrocodone, generic benadryl and muscle relaxers. Pt's roommate called EMS. Patient was found by EMS lying on the ground. Per RN note, pt  reported in triage that he was trying to kill himself. Pt denies this upon assessment and indicates that he just wanted to go to sleep. Pt reports that, for the last couple of months, he has only been getting 3 hours/night of sleep. Pt was unable to identify a trigger or stressor as to the sudden lack in sleep. Pt denies SI, HI or any hx of either. Pt endorses long time VH of shadow figures. Pt denies any psych hx. "  Pt has spent 2.5 days in the ED without incident and has been cooperative with staff. Since he has sobered up, he has denied any self-harm thoughts. Seen as above today on 04/16/16.   Past Psychiatric History: denies  Risk to Self: Suicidal Ideation: No Suicidal Intent: No Is patient at risk for suicide?: No Suicidal Plan?: No Access to Means: No Specify Access to Suicidal Means: No What has been your use of drugs/alcohol within the last 12 months?: day of arrival, socially Intentional Self Injurious Behavior: None Risk to Others: Homicidal Ideation: No Thoughts of Harm to Others: No Current Homicidal Intent: No Current Homicidal Plan: No Access to Homicidal Means: No History of harm to others?: No Assessment of Violence: None Noted Does patient have access to weapons?: No Criminal Charges Pending?: No Does patient have a court date: Yes Court Date: 04/24/16 Prior Inpatient Therapy: Prior Inpatient Therapy: No Prior Outpatient Therapy: Prior Outpatient Therapy: No Does patient have an ACCT team?: No Does patient have Intensive In-House Services?  : No Does patient have [facility] services? : No Does patient have P4CC  services?: No  Past Medical History:  Past Medical History:  Diagnosis Date  . AIDS (HCC)   . Anal condylomata   . HIV positive (HCC)   . Rectal gonorrhea     Past Surgical History:  Procedure Laterality Date  . INCISION AND DRAINAGE PERIRECTAL ABSCESS N/A 11/03/2013   Procedure: IRRIGATION AND DEBRIDEMENT PERIRECTAL ABSCESS;  Surgeon: Atilano Ina,  MD;  Location: [facility];  Service: General;  Laterality: N/A;  . WARTS REMOVED     Family History: No family history on file. Family Psychiatric  History: denies Social History:  History  Alcohol Use  . Yes     History  Drug Use No    Social History   Social History  . Marital status: Single    Spouse name: N/A  . Number of children: N/A  . Years of education: N/A   Social History Main Topics  . Smoking status: Never Smoker  . Smokeless tobacco: Never Used  . Alcohol use Yes  . Drug use: No  . Sexual activity: Yes   Other Topics Concern  . None   Social History Narrative  . None   Additional Social History:    Allergies:  No Known Allergies  Labs: No results found for this or any previous visit (from the past 48 hour(s)).  Current Facility-Administered Medications  Medication Dose Route Frequency Provider Last Rate Last Dose  . Darunavir Ethanolate (PREZISTA) tablet 800 mg  800 mg Oral QHS Maia Plan, MD      . elvitegravir-cobicistat-emtricitabine-tenofovir (GENVOYA) 150-150-200-10 MG tablet 1 tablet  1 tablet Oral QHS Maia Plan, MD      . senna [facility]) tablet 8.6 mg  1 tablet Oral BID [facility] Ward, PA-C   8.6 mg at 04/16/16 1156  . spironolactone (ALDACTONE) tablet 50 mg  50 mg Oral BID Maia Plan, MD   50 mg at 04/16/16 1126   Current Outpatient Prescriptions  Medication Sig Dispense Refill  . Darunavir Ethanolate (PREZISTA) 800 MG tablet Take 800 mg by mouth at bedtime.    . elvitegravir-cobicistat-emtricitabine-tenofovir (GENVOYA) 150-150-200-10 MG TABS tablet Take 1 tablet by mouth at bedtime.    Marland Kitchen estradiol (ESTRACE) 2 MG tablet Take 2 mg by mouth daily.    Marland Kitchen spironolactone (ALDACTONE) 50 MG tablet Take 50 mg by mouth 2 (two) times daily.    Marland Kitchen docusate sodium 100 MG CAPS Take 100 mg by mouth 2 (two) times daily. (Patient not taking: Reported on 04/14/2016) 10 capsule 0  . senna (SENOKOT) 8.6 MG TABS tablet Take 1 tablet (8.6 mg total) by  mouth 2 (two) times daily. (Patient not taking: Reported on 04/14/2016) 120 each 0  . tamsulosin (FLOMAX) 0.4 MG CAPS capsule Take 1 capsule (0.4 mg total) by mouth daily after supper. (Patient not taking: Reported on 04/14/2016) 15 capsule 0    Musculoskeletal: Strength & Muscle Tone: within normal limits Gait & Station: normal Patient leans: N/A  Psychiatric Specialty Exam: Physical Exam  Review of Systems  Psychiatric/Behavioral: Positive for depression and substance abuse. Negative for hallucinations and suicidal ideas. The patient is nervous/anxious.   All other systems reviewed and are negative.   Blood pressure 122/78, pulse 74, temperature 98.2 F (36.8 C), temperature source Oral, resp. rate 21, height 5\' 10"  (1.778 m), weight 59 kg (130 lb), SpO2 100 %.Body mass index is 18.65 kg/m.  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  Suicidal Thoughts:  No  Homicidal Thoughts:  No  Memory:  Immediate;   Fair Recent;   Fair Remote;   Fair  Judgement:  Fair  Insight:  Fair  Psychomotor Activity:  Normal  Concentration:  Concentration: Fair and Attention Span: Fair  Recall:  [employer] of Knowledge:  Fair  Language:  Fair  Akathisia:  No  Handed:    AIMS (if indicated):     Assets:  Communication Skills Desire for Improvement Resilience Social Support  ADL's:  Intact  Cognition:  WNL  Sleep:      Treatment Plan Summary: Alcohol abuse with intoxication (HCC) stable for outpatient management   Disposition: No evidence of imminent risk to self or others at present.   Patient does not meet criteria for psychiatric inpatient  admission. Supportive therapy provided about ongoing stressors. Discussed crisis plan, support from social network, calling 911, coming to the Emergency Department, and calling Suicide Hotline.  Beau Fanny, [place] 04/16/2016 5:06 PM

## 2016-04-16 NOTE — ED Notes (Signed)
Pt aware of tx plan

## 2016-04-16 NOTE — ED Notes (Signed)
Pt aware will advise him of tx plan after Renata CapriceConrad discusses w/psych and EDP. Voiced understanding.

## 2016-04-16 NOTE — ED Notes (Signed)
Pt states he did not attempt to OD. States he and his roommate broke up x 3 weeks ago but still live together until tomorrow. States he had gone out w/his roommate and a male friend and was drinking ETOH. States he was upset w/them d/t felt they were being ugly to him so he called an [employer] to take him home. States he has difficulty sleeping and takes sleeping pills and muscle relaxers at night. States he took 3 muscle relaxers that night and the rest fell in the floor. States he then tripped over a basket and was unable to get up. States his roommate called EMS. States his vehicle was left downtown and his HIV meds are in it. States he does not know where his keys are so is unable to obtain his meds. States he does not feel SI or HI and he wants to go home. States has no hx of admissions to psych hospitals and denies depression. Denies taking Hydrocodone as staff was advised on arrival to ED - advised pt his UDS positive for opiates - continues to deny. Pt noted to be calm, cooperative. Denies AH/VH - does not appear to be responding any internal stimuli - no self-injurious behaviors noted.

## 2016-11-02 ENCOUNTER — Emergency Department (HOSPITAL_BASED_OUTPATIENT_CLINIC_OR_DEPARTMENT_OTHER)
Admission: EM | Admit: 2016-11-02 | Discharge: 2016-11-02 | Disposition: A | Discharge status: Home / Self Care | Payer: 59 | Attending: Emergency Medicine | Admitting: Emergency Medicine

## 2016-11-02 ENCOUNTER — Encounter (HOSPITAL_BASED_OUTPATIENT_CLINIC_OR_DEPARTMENT_OTHER): Payer: Self-pay | Admitting: Emergency Medicine

## 2016-11-02 ENCOUNTER — Emergency Department (HOSPITAL_BASED_OUTPATIENT_CLINIC_OR_DEPARTMENT_OTHER): Payer: 59

## 2016-11-02 DIAGNOSIS — R1030 Lower abdominal pain, unspecified: Secondary | ICD-10-CM | POA: Diagnosis present

## 2016-11-02 DIAGNOSIS — B20 Human immunodeficiency virus [HIV] disease: Secondary | ICD-10-CM | POA: Insufficient documentation

## 2016-11-02 DIAGNOSIS — Z79899 Other long term (current) drug therapy: Secondary | ICD-10-CM | POA: Insufficient documentation

## 2016-11-02 DIAGNOSIS — K5900 Constipation, unspecified: Secondary | ICD-10-CM | POA: Diagnosis not present

## 2016-11-02 DIAGNOSIS — R1031 Right lower quadrant pain: Secondary | ICD-10-CM

## 2016-11-02 LAB — URINALYSIS, ROUTINE W REFLEX MICROSCOPIC
BILIRUBIN URINE: NEGATIVE
Glucose, UA: NEGATIVE mg/dL
Hgb urine dipstick: NEGATIVE
Ketones, ur: NEGATIVE mg/dL
Leukocytes, UA: NEGATIVE
NITRITE: NEGATIVE
PROTEIN: NEGATIVE mg/dL
pH: 6 (ref 5.0–8.0)

## 2016-11-02 LAB — COMPREHENSIVE METABOLIC PANEL
ALBUMIN: 3.8 g/dL (ref 3.5–5.0)
ALK PHOS: 59 U/L (ref 38–126)
ALT: 9 U/L — AB (ref 17–63)
ANION GAP: 5 (ref 5–15)
AST: 20 U/L (ref 15–41)
BUN: 10 mg/dL (ref 6–20)
CO2: 28 mmol/L (ref 22–32)
Calcium: 9 mg/dL (ref 8.9–10.3)
Chloride: 103 mmol/L (ref 101–111)
Creatinine, Ser: 0.98 mg/dL (ref 0.61–1.24)
GFR calc Af Amer: >60 mL/min (ref >60)
GFR calc non Af Amer: >60 mL/min (ref >60)
GLUCOSE: 96 mg/dL (ref 65–99)
Potassium: 3.8 mmol/L (ref 3.5–5.1)
SODIUM: 136 mmol/L (ref 135–145)
Total Bilirubin: 0.5 mg/dL (ref 0.3–1.2)
Total Protein: 7.2 g/dL (ref 6.5–8.1)

## 2016-11-02 LAB — CBC
HCT: 43.3 % (ref 39.0–52.0)
HEMOGLOBIN: 14 g/dL (ref 13.0–17.0)
MCH: 27.4 pg (ref 26.0–34.0)
MCHC: 32.3 g/dL (ref 30.0–36.0)
MCV: 84.7 fL (ref 78.0–100.0)
PLATELETS: 205 10*3/uL (ref 150–400)
RBC: 5.11 MIL/uL (ref 4.22–5.81)
RDW: 13.1 % (ref 11.5–15.5)
WBC: 4.9 10*3/uL (ref 4.0–10.5)

## 2016-11-02 LAB — LIPASE, BLOOD: Lipase: 21 U/L (ref 11–51)

## 2016-11-02 MED ORDER — IOPAMIDOL (ISOVUE-300) INJECTION 61%
100.0000 mL | Freq: Once | INTRAVENOUS | Status: AC | PRN
  Administered 2016-11-02: 100 mL via INTRAVENOUS

## 2016-11-02 MED ORDER — DOCUSATE SODIUM 100 MG PO CAPS: 100.0000 mg | ORAL_CAPSULE | Freq: Two times a day (BID) | ORAL | 0 refills | Status: AC

## 2016-11-02 MED ORDER — DICYCLOMINE HCL 20 MG PO TABS: 20.0000 mg | ORAL_TABLET | Freq: Two times a day (BID) | ORAL | 0 refills | Status: AC

## 2016-11-02 NOTE — ED Triage Notes (Signed)
Lower abd pain since yesterday. Vomited once yesterday, denies diarrhea.

## 2016-11-02 NOTE — ED Provider Notes (Signed)
[facility] Provider Note   CSN: [id number] Arrival date & time: 11/02/16  1306     History   Chief Complaint Chief Complaint  Patient presents with  . Abdominal Pain    HPI Thaddeaus Monica is a 26 y.o. male.  Patient is a 26 year old male with a history of HIV who presents with abdominal pain. He states he's had pain across his lower abdomen for about 2-3 days. It was worsening today. He had one episode of vomiting yesterday but none today. No urinary symptoms. No penile discharge. He is not been having normal bowel movements but doesn't feel constipated. He denies any diarrhea. No known fevers. No history of abdominal surgeries. He has not taken anything for the pain at home.      Past Medical History:  Diagnosis Date  . AIDS (HCC)   . Anal condylomata   . HIV positive (HCC)   . Rectal gonorrhea     Patient Active Problem List   Diagnosis Date Noted  . Alcohol abuse with intoxication (HCC) 04/16/2016  . Rectal abscess 11/03/2013  . Urinary retention 11/03/2013  . HIV disease (HCC) 11/03/2013    Past Surgical History:  Procedure Laterality Date  . INCISION AND DRAINAGE PERIRECTAL ABSCESS N/A 11/03/2013   Procedure: IRRIGATION AND DEBRIDEMENT PERIRECTAL ABSCESS;  Surgeon: Atilano Ina, MD;  Location: [facility];  Service: General;  Laterality: N/A;  . WARTS REMOVED         Home Medications    Prior to Admission medications   Medication Sig Start Date End Date Taking? Authorizing Provider  Darunavir Ethanolate (PREZISTA) 800 MG tablet Take 800 mg by mouth at bedtime.    [provider]  dicyclomine (BENTYL) 20 MG tablet Take 1 tablet (20 mg total) by mouth 2 (two) times daily. 11/02/16   Rolan Bucco, MD  docusate sodium (COLACE) 100 MG capsule Take 1 capsule (100 mg total) by mouth every 12 (twelve) hours. 11/02/16   Rolan Bucco, MD  elvitegravir-cobicistat-emtricitabine-tenofovir (GENVOYA) 150-150-200-10 MG TABS tablet Take 1 tablet by  mouth at bedtime.    [provider]  estradiol (ESTRACE) 2 MG tablet Take 2 mg by mouth daily.    [provider]  senna (SENOKOT) 8.6 MG TABS tablet Take 1 tablet (8.6 mg total) by mouth 2 (two) times daily. Patient not taking: Reported on 04/14/2016 11/05/13   Elgergawy, Leana Roe, MD  spironolactone (ALDACTONE) 50 MG tablet Take 50 mg by mouth 2 (two) times daily.    [provider]  tamsulosin (FLOMAX) 0.4 MG CAPS capsule Take 1 capsule (0.4 mg total) by mouth daily after supper. Patient not taking: Reported on 04/14/2016 11/05/13   Elgergawy, Leana Roe, MD    Family History No family history on file.  Social History Social History  Substance Use Topics  . Smoking status: Never Smoker  . Smokeless tobacco: Never Used  . Alcohol use Yes     Allergies   Patient has no known allergies.   Review of Systems Review of Systems  Constitutional: Negative for chills, diaphoresis, fatigue and fever.  HENT: Negative for congestion, rhinorrhea and sneezing.   Eyes: Negative.   Respiratory: Negative for cough, chest tightness and shortness of breath.   Cardiovascular: Negative for chest pain and leg swelling.  Gastrointestinal: Positive for abdominal pain, nausea and vomiting. Negative for blood in stool and diarrhea.  Genitourinary: Negative for difficulty urinating, flank pain, frequency and hematuria.  Musculoskeletal: Negative for arthralgias and back pain.  Skin:  Negative for rash.  Neurological: Negative for dizziness, speech difficulty, weakness, numbness and headaches.     Physical Exam Updated Vital Signs BP 121/80 (BP Location: Left Arm)   Pulse 61   Temp 98.2 F (36.8 C)   Resp 18   Ht  (1.778 m)   Wt 61.7 kg (136 lb)   SpO2 99%   BMI 19.51 kg/m   Physical Exam  Constitutional: He is oriented to person, place, and time. He appears well-developed and well-nourished.  HENT:  Head: Normocephalic and atraumatic.  Eyes: Pupils are  equal, round, and reactive to light.  Neck: Normal range of motion. Neck supple.  Cardiovascular: Normal rate, regular rhythm and normal heart sounds.   Pulmonary/Chest: Effort normal and breath sounds normal. No respiratory distress. He has no wheezes. He has no rales. He exhibits no tenderness.  Abdominal: Soft. Bowel sounds are normal. There is tenderness (Positive tenderness to the right lower quadrant the abdomen). There is no rebound and no guarding.  Musculoskeletal: Normal range of motion. He exhibits no edema.  Lymphadenopathy:    He has no cervical adenopathy.  Neurological: He is alert and oriented to person, place, and time.  Skin: Skin is warm and dry. No rash noted.  Psychiatric: He has a normal mood and affect.     ED Treatments / Results  Labs (all labs ordered are listed, but only abnormal results are displayed) Labs Reviewed  COMPREHENSIVE METABOLIC PANEL - Abnormal; Notable for the following:       Result Value   ALT 9 (*)    All other components within normal limits  URINALYSIS, ROUTINE W REFLEX MICROSCOPIC - Abnormal; Notable for the following:    Specific Gravity, Urine >1.030 (*)    All other components within normal limits  LIPASE, BLOOD  CBC    EKG  EKG Interpretation None       Radiology Ct Abdomen Pelvis W Contrast  Result Date: 11/02/2016 CLINICAL DATA:  Lower abdominal pain, primarily on the right, for the past 3 days. Constipation. One episode of vomiting last night. HIV positive. History of rectal gonorrhea and perirectal abscess. EXAM: CT ABDOMEN AND PELVIS WITH CONTRAST TECHNIQUE: Multidetector CT imaging of the abdomen and pelvis was performed using the standard protocol following bolus administration of intravenous contrast. CONTRAST:  ISOVUE-300 IOPAMIDOL (ISOVUE-300) INJECTION 61% COMPARISON:  02/24/2014. FINDINGS: Lower chest: Minimal bilateral dependent atelectasis. Hepatobiliary: 4 mm rounded area of low density in the right lobe of  the liver on image number 16 series 2, not significant change. Unremarkable gallbladder. Pancreas: Unremarkable. No pancreatic ductal dilatation or surrounding inflammatory changes. Spleen: Normal in size without focal abnormality. Adrenals/Urinary Tract: Adrenal glands are unremarkable. Kidneys are normal, without renal calculi, focal lesion, or hydronephrosis. Bladder is unremarkable. Stomach/Bowel: Unremarkable stomach, small bowel and colon. No evidence of appendicitis. Vascular/Lymphatic: No significant vascular findings are present. No enlarged abdominal or pelvic lymph nodes. Reproductive: Prostate is unremarkable. Other: Minimal free peritoneal fluid. Musculoskeletal: Unremarkable. IMPRESSION: 1. No acute abnormality. 2. Stable 4 mm rounded gave low density in the right lobe of the liver. The long-term stability is compatible with a benign process. Electronically Signed   By: Beckie Salts M.D.   On: 11/02/2016 14:42    Procedures Procedures (including critical care time)  Medications Ordered in ED Medications  iopamidol (ISOVUE-300) 61 % injection 100 mL (100 mLs Intravenous Contrast Given 11/02/16 1415)     Initial Impression / Assessment and Plan / ED Course  I  have reviewed the triage vital signs and the nursing notes.  Pertinent labs & imaging results that were available during my care of the patient were reviewed by me and considered in my medical decision making (see chart for details).    Patient is a 26 year old male who presents with lower abdominal pain. His CT scan is negative for acute abnormalities. He's otherwise well-appearing. His labs are non-concerning. He has no vomiting in the emergency department. He was discharged home in good condition. His pain may be related to some constipation. I gave him a prescription for Bentyl and Colace. I encouraged him to follow-up with his PCP who is with [facility]  Return precautions were given.   Final Clinical Impressions(s) / ED  Diagnoses   Final diagnoses:  Right lower quadrant abdominal pain  Constipation, unspecified constipation type    New Prescriptions New Prescriptions   DICYCLOMINE (BENTYL) 20 MG TABLET    Take 1 tablet (20 mg total) by mouth 2 (two) times daily.   DOCUSATE SODIUM (COLACE) 100 MG CAPSULE    Take 1 capsule (100 mg total) by mouth every 12 (twelve) hours.     Rolan Bucco, MD 11/02/16 1500

## 2017-09-17 DIAGNOSIS — K6289 Other specified diseases of anus and rectum: Secondary | ICD-10-CM | POA: Diagnosis not present

## 2017-09-17 DIAGNOSIS — Z79899 Other long term (current) drug therapy: Secondary | ICD-10-CM | POA: Insufficient documentation

## 2017-09-17 DIAGNOSIS — B20 Human immunodeficiency virus [HIV] disease: Secondary | ICD-10-CM | POA: Diagnosis not present

## 2017-09-18 ENCOUNTER — Emergency Department (HOSPITAL_BASED_OUTPATIENT_CLINIC_OR_DEPARTMENT_OTHER)
Admission: EM | Admit: 2017-09-18 | Discharge: 2017-09-18 | Disposition: A | Discharge status: Home / Self Care | Payer: 59 | Attending: Emergency Medicine | Admitting: Emergency Medicine

## 2017-09-18 ENCOUNTER — Other Ambulatory Visit: Payer: Self-pay

## 2017-09-18 ENCOUNTER — Encounter (HOSPITAL_BASED_OUTPATIENT_CLINIC_OR_DEPARTMENT_OTHER): Payer: Self-pay | Admitting: Emergency Medicine

## 2017-09-18 DIAGNOSIS — K6289 Other specified diseases of anus and rectum: Secondary | ICD-10-CM

## 2017-09-18 NOTE — Discharge Instructions (Addendum)
Metamucil: 1 heaping teaspoon in a glass of water 3 times daily.  Colace: (Equate stool softener) 100 mg twice daily.  We will call you with your cultures indicate you require further treatment or action.

## 2017-09-18 NOTE — ED Provider Notes (Signed)
[facility] EMERGENCY DEPARTMENT Provider Note   CSN: [id number] Arrival date & time: 09/17/17  2357     History   Chief Complaint Chief Complaint  Patient presents with  . Rectal Pain    HPI Corey Cross is a 27 y.o. male.  Patient is a 27 year old male with history of HIV disease.  He presents today for evaluation of rectal pain.  He reports pain when he goes to the bathroom and with passing stool.  He denies fevers or chills.  He has noted some blood when he wipes.  The history is provided by the patient.    Past Medical History:  Diagnosis Date  . AIDS (HCC)   . Anal condylomata   . HIV positive (HCC)   . Rectal gonorrhea     Patient Active Problem List   Diagnosis Date Noted  . Alcohol abuse with intoxication (HCC) 04/16/2016  . Rectal abscess 11/03/2013  . Urinary retention 11/03/2013  . HIV disease (HCC) 11/03/2013    Past Surgical History:  Procedure Laterality Date  . INCISION AND DRAINAGE PERIRECTAL ABSCESS N/A 11/03/2013   Procedure: IRRIGATION AND DEBRIDEMENT PERIRECTAL ABSCESS;  Surgeon: Atilano InaEric M Wilson, MD;  Location: [facility];  Service: General;  Laterality: N/A;  . WARTS REMOVED          Home Medications    Prior to Admission medications   Medication Sig Start Date End Date Taking? Authorizing Provider  Bictegravir-Emtricitab-Tenofov (BIKTARVY PO) Take by mouth daily.   Yes [provider]  Darunavir Ethanolate (PREZISTA) 800 MG tablet Take 800 mg by mouth at bedtime.    [provider]  dicyclomine (BENTYL) 20 MG tablet Take 1 tablet (20 mg total) by mouth 2 (two) times daily. 11/02/16   Rolan BuccoBelfi, Melanie, MD  docusate sodium (COLACE) 100 MG capsule Take 1 capsule (100 mg total) by mouth every 12 (twelve) hours. 11/02/16   Rolan BuccoBelfi, Melanie, MD  elvitegravir-cobicistat-emtricitabine-tenofovir (GENVOYA) 150-150-200-10 MG TABS tablet Take 1 tablet by mouth at bedtime.    [provider]  estradiol (ESTRACE) 2 MG  tablet Take 2 mg by mouth daily.    [provider]  senna (SENOKOT) 8.6 MG TABS tablet Take 1 tablet (8.6 mg total) by mouth 2 (two) times daily. Patient not taking: Reported on 04/14/2016 11/05/13   Elgergawy, [name], MD  spironolactone (ALDACTONE) 50 MG tablet Take 50 mg by mouth 2 (two) times daily.    [provider]  tamsulosin (FLOMAX) 0.4 MG CAPS capsule Take 1 capsule (0.4 mg total) by mouth daily after supper. Patient not taking: Reported on 04/14/2016 11/05/13   Elgergawy, [name], MD    Family History Family History  Problem Relation Age of Onset  . Cancer Mother   . Hypertension Father   . Cancer Other   . Diabetes Other   . Dementia Other     Social History Social History   Tobacco Use  . Smoking status: Never Smoker  . Smokeless tobacco: Never Used  Substance Use Topics  . Alcohol use: Yes  . Drug use: No     Allergies   Patient has no known allergies.   Review of Systems Review of Systems  All other systems reviewed and are negative.    Physical Exam Updated Vital Signs BP 119/83 (BP Location: Left Arm)   Pulse 81   Temp 98.4 F (36.9 C) (Oral)   Resp 16   Ht 5\' 10"  (1.778 m)   Wt 61.4 kg  SpO2 100%   BMI 19.43 kg/m   Physical Exam  Constitutional: He is oriented to person, place, and time. He appears well-developed and well-nourished. No distress.  HENT:  Head: Normocephalic and atraumatic.  Mouth/Throat: Oropharynx is clear and moist.  Neck: Normal range of motion. Neck supple.  Cardiovascular: Normal rate and regular rhythm. Exam reveals no friction rub.  No murmur heard. Pulmonary/Chest: Effort normal and breath sounds normal. No respiratory distress. He has no wheezes. He has no rales.  Abdominal: Soft. Bowel sounds are normal. He exhibits no distension. There is no tenderness.  Genitourinary: Rectum normal.  Genitourinary Comments: I see no obvious fissure or lesion.  Total exam is otherwise unremarkable    Musculoskeletal: Normal range of motion. He exhibits no edema.  Neurological: He is alert and oriented to person, place, and time. Coordination normal.  Skin: Skin is warm and dry. He is not diaphoretic.  Nursing note and vitals reviewed.    ED Treatments / Results  Labs (all labs ordered are listed, but only abnormal results are displayed) Labs Reviewed  GC/CHLAMYDIA PROBE AMP ([place] NOT [facility]    EKG None  Radiology No results found.  Procedures Procedures (including critical care time)  Medications Ordered in ED Medications - No data to display   Initial Impression / Assessment and Plan / ED Course  I have reviewed the triage vital signs and the nursing notes.  Pertinent labs & imaging results that were available during my care of the patient were reviewed by me and considered in my medical decision making (see chart for details).  Patient with HIV disease practicing receptive anal intercourse last month with rectal pain.  GC/Chlamydia swabs sent.  Will treat as anal fissure for now pending results.    Final Clinical Impressions(s) / ED Diagnoses   Final diagnoses:  None    ED Discharge Orders    None       Geoffery Lyonselo, Jeri Rawlins, MD 09/18/17 0127

## 2017-09-18 NOTE — ED Triage Notes (Signed)
Pt is c/o rectal pain for the past month  Pt states he has has put hemorrhoid cream on it and it burns  Pt states he had some blood on the tissue when it first started but not now

## 2017-09-18 NOTE — ED Notes (Signed)
Rectal exam for Dr. Judd Lienelo chaperoned. No external hemorrhoids present.

## 2017-09-19 ENCOUNTER — Other Ambulatory Visit: Payer: Self-pay

## 2017-09-19 ENCOUNTER — Emergency Department (HOSPITAL_BASED_OUTPATIENT_CLINIC_OR_DEPARTMENT_OTHER)
Admission: EM | Admit: 2017-09-19 | Discharge: 2017-09-20 | Disposition: A | Discharge status: Home / Self Care | Payer: 59 | Attending: Emergency Medicine | Admitting: Emergency Medicine

## 2017-09-19 ENCOUNTER — Encounter (HOSPITAL_BASED_OUTPATIENT_CLINIC_OR_DEPARTMENT_OTHER): Payer: Self-pay | Admitting: *Deleted

## 2017-09-19 DIAGNOSIS — A546 Gonococcal infection of anus and rectum: Secondary | ICD-10-CM | POA: Insufficient documentation

## 2017-09-19 DIAGNOSIS — Z79899 Other long term (current) drug therapy: Secondary | ICD-10-CM | POA: Insufficient documentation

## 2017-09-19 LAB — GC/CHLAMYDIA PROBE AMP (~~LOC~~) NOT AT ARMC
Chlamydia: NEGATIVE
NEISSERIA GONORRHEA: POSITIVE — AB

## 2017-09-19 NOTE — ED Triage Notes (Signed)
Pt requesting treatment for STDs

## 2017-09-20 MED ORDER — CEFTRIAXONE SODIUM 250 MG IJ SOLR
250.0000 mg | Freq: Once | INTRAMUSCULAR | Status: AC
  Administered 2017-09-20: 250 mg via INTRAMUSCULAR
  Filled 2017-09-20: qty 250

## 2017-09-20 MED ORDER — AZITHROMYCIN 250 MG PO TABS
1000.0000 mg | ORAL_TABLET | Freq: Once | ORAL | Status: AC
  Administered 2017-09-20: 1000 mg via ORAL
  Filled 2017-09-20: qty 4

## 2017-09-20 NOTE — ED Notes (Signed)
Pt received phone call from Eugenia McalpineLori Berdick, RN regarding his positive GC test from 09/18/17 and was told to return to the ED to get a shot for gonorrhea treatment.  Pt denies any other needs, informed of wait time

## 2017-09-20 NOTE — ED Notes (Signed)
Pt verbalizes understanding of d/c instructions and denies any further needs at this time. 

## 2017-09-20 NOTE — ED Provider Notes (Signed)
[facility] Provider Note: Corey DellJ. Lane Drexler Maland, MD, FACEP  CSN: [id number] MRN: [id number] ARRIVAL: 09/19/17 at 2253 ROOM: MH08/MH08   CHIEF COMPLAINT  SEXUALLY TRANSMITTED DISEASE   HISTORY OF PRESENT ILLNESS  09/20/17 12:52 AM Corey Cross is a 27 y.o. male who was seen on August 15 for what he thought was a hemorrhoid.  An anal swab showed positive for gonorrhea.  He was told to return to the ED for treatment with Rocephin.  He denies penile discharge or dysuria.  He denies abdominal pain.    Past Medical History:  Diagnosis Date  . AIDS (HCC)   . Anal condylomata   . HIV positive (HCC)   . Rectal gonorrhea     Past Surgical History:  Procedure Laterality Date  . INCISION AND DRAINAGE PERIRECTAL ABSCESS N/A 11/03/2013   Procedure: IRRIGATION AND DEBRIDEMENT PERIRECTAL ABSCESS;  Surgeon: Atilano InaEric M Wilson, MD;  Location: [facility];  Service: General;  Laterality: N/A;  . WARTS REMOVED      Family History  Problem Relation Age of Onset  . Cancer Mother   . Hypertension Father   . Cancer Other   . Diabetes Other   . Dementia Other     Social History   Tobacco Use  . Smoking status: Never Smoker  . Smokeless tobacco: Never Used  Substance Use Topics  . Alcohol use: Yes  . Drug use: No    Prior to Admission medications   Medication Sig Start Date End Date Taking? Authorizing Provider  Bictegravir-Emtricitab-Tenofov (BIKTARVY PO) Take by mouth daily.    [provider]  Darunavir Ethanolate (PREZISTA) 800 MG tablet Take 800 mg by mouth at bedtime.    [provider]  dicyclomine (BENTYL) 20 MG tablet Take 1 tablet (20 mg total) by mouth 2 (two) times daily. 11/02/16   Rolan BuccoBelfi, Melanie, MD  docusate sodium (COLACE) 100 MG capsule Take 1 capsule (100 mg total) by mouth every 12 (twelve) hours. 11/02/16   Rolan BuccoBelfi, Melanie, MD  elvitegravir-cobicistat-emtricitabine-tenofovir (GENVOYA) 150-150-200-10 MG TABS tablet Take 1 tablet by mouth at bedtime.     [provider]  estradiol (ESTRACE) 2 MG tablet Take 2 mg by mouth daily.    [provider]  senna (SENOKOT) 8.6 MG TABS tablet Take 1 tablet (8.6 mg total) by mouth 2 (two) times daily. Patient not taking: Reported on 04/14/2016 11/05/13   Elgergawy, [name], MD  spironolactone (ALDACTONE) 50 MG tablet Take 50 mg by mouth 2 (two) times daily.    [provider]  tamsulosin (FLOMAX) 0.4 MG CAPS capsule Take 1 capsule (0.4 mg total) by mouth daily after supper. Patient not taking: Reported on 04/14/2016 11/05/13   Elgergawy, [name], MD    Allergies Patient has no known allergies.   REVIEW OF SYSTEMS  Negative except as noted here or in the History of Present Illness.   PHYSICAL EXAMINATION  Initial Vital Signs Blood pressure 113/77, pulse 80, temperature 99.6 F (37.6 C), temperature source Oral, resp. rate 18, height 5\' 10"  (1.778 m), weight 61.2 kg, SpO2 100 %.  Examination General: Well-developed, well-nourished male in no acute distress; appearance consistent with age of record HENT: normocephalic; atraumatic Eyes: Normal appearance Neck: supple Heart: regular rate and rhythm Lungs: clear to auscultation bilaterally Abdomen: soft; nondistended; nontender; bowel sounds present Extremities: No deformity; full range of motion Neurologic: Awake, alert and oriented; motor function intact in all extremities and symmetric; no facial droop Skin: Warm and dry Psychiatric: Normal mood  and affect   RESULTS  Summary of this visit's results, reviewed by myself:   EKG Interpretation  Date/Time:    Ventricular Rate:    PR Interval:    QRS Duration:   QT Interval:    QTC Calculation:   R Axis:     Text Interpretation:        Laboratory Studies: No results found for this or any previous visit (from the past 24 hour(s)). Imaging Studies: No results found.  ED COURSE and MDM  Nursing notes and initial vitals signs, including pulse oximetry,  reviewed.  Vitals:   09/19/17 2258  BP: 113/77  Pulse: 80  Resp: 18  Temp: 99.6 F (37.6 C)  TempSrc: Oral  SpO2: 100%  Weight: 61.2 kg  Height: 5\' 10"  (1.778 m)   We will treat with Rocephin and Zithromax.  We will draw an RPR to test for syphilis.  Patient already known to be HIV positive pain on antiretroviral therapy.  PROCEDURES    ED DIAGNOSES     ICD-10-CM   1. Gonorrhea of anus A54.6        Sabine Tenenbaum, MD 09/20/17 [phone]

## 2017-09-22 LAB — RPR, QUANT+TP ABS (REFLEX)
Rapid Plasma Reagin, Quant: 1:64 {titer} — ABNORMAL HIGH
T Pallidum Abs: POSITIVE — AB

## 2017-09-22 LAB — RPR: RPR Ser Ql: REACTIVE — AB

## 2017-09-24 ENCOUNTER — Encounter (HOSPITAL_BASED_OUTPATIENT_CLINIC_OR_DEPARTMENT_OTHER): Payer: Self-pay | Admitting: Emergency Medicine

## 2017-09-24 ENCOUNTER — Emergency Department (HOSPITAL_BASED_OUTPATIENT_CLINIC_OR_DEPARTMENT_OTHER)
Admission: EM | Admit: 2017-09-24 | Discharge: 2017-09-24 | Disposition: A | Discharge status: Home / Self Care | Payer: 59 | Attending: Emergency Medicine | Admitting: Emergency Medicine

## 2017-09-24 ENCOUNTER — Other Ambulatory Visit: Payer: Self-pay

## 2017-09-24 DIAGNOSIS — A539 Syphilis, unspecified: Secondary | ICD-10-CM | POA: Diagnosis present

## 2017-09-24 DIAGNOSIS — Z79899 Other long term (current) drug therapy: Secondary | ICD-10-CM | POA: Diagnosis not present

## 2017-09-24 DIAGNOSIS — B20 Human immunodeficiency virus [HIV] disease: Secondary | ICD-10-CM | POA: Insufficient documentation

## 2017-09-24 MED ORDER — PENICILLIN G BENZATHINE 1200000 UNIT/2ML IM SUSP
2.4000 10*6.[IU] | Freq: Once | INTRAMUSCULAR | Status: AC
  Administered 2017-09-24: 2.4 10*6.[IU] via INTRAMUSCULAR
  Filled 2017-09-24: qty 4

## 2017-09-24 NOTE — Discharge Instructions (Addendum)
Please call your infectious disease doctor this week for follow-up.

## 2017-09-24 NOTE — ED Notes (Signed)
Pt understood dc material and follow up information. NAD noted 

## 2017-09-24 NOTE — ED Triage Notes (Signed)
Pt states he was seen here on the 16th and had STD testing  Pt was called and told he was positive for syphilis and needs to be treated for that

## 2017-09-24 NOTE — ED Provider Notes (Signed)
[facility] EMERGENCY DEPARTMENT Provider Note   CSN: [id number] Arrival date & time: 09/24/17  0045     History   Chief Complaint Chief Complaint  Patient presents with  . STD treatment    HPI Corey Cross is a 27 y.o. male.  HPI Patient Presents for abnormal labs.  He reports he was called and informed that he has syphilis.  He denies any new complaints.  He denies any penile lesions or new rash.  No fevers or vomiting.  He has known history of HIV and is medication compliant.  [facility] Course is stable, nothing worsens his symptoms Past Medical History:  Diagnosis Date  . AIDS (HCC)   . Anal condylomata   . HIV positive (HCC)   . Rectal gonorrhea     Patient Active Problem List   Diagnosis Date Noted  . Alcohol abuse with intoxication (HCC) 04/16/2016  . Rectal abscess 11/03/2013  . Urinary retention 11/03/2013  . HIV disease (HCC) 11/03/2013    Past Surgical History:  Procedure Laterality Date  . INCISION AND DRAINAGE PERIRECTAL ABSCESS N/A 11/03/2013   Procedure: IRRIGATION AND DEBRIDEMENT PERIRECTAL ABSCESS;  Surgeon: Atilano InaEric M Wilson, MD;  Location: [facility];  Service: General;  Laterality: N/A;  . WARTS REMOVED          Home Medications    Prior to Admission medications   Medication Sig Start Date End Date Taking? Authorizing Provider  Bictegravir-Emtricitab-Tenofov (BIKTARVY PO) Take by mouth daily.    [provider]  dicyclomine (BENTYL) 20 MG tablet Take 1 tablet (20 mg total) by mouth 2 (two) times daily. 11/02/16   Rolan BuccoBelfi, Melanie, MD  docusate sodium (COLACE) 100 MG capsule Take 1 capsule (100 mg total) by mouth every 12 (twelve) hours. 11/02/16   Rolan BuccoBelfi, Melanie, MD  estradiol (ESTRACE) 2 MG tablet Take 2 mg by mouth daily.    [provider]  spironolactone (ALDACTONE) 50 MG tablet Take 50 mg by mouth 2 (two) times daily.    [provider]    Family History Family  History  Problem Relation Age of Onset  . Cancer Mother   . Hypertension Father   . Cancer Other   . Diabetes Other   . Dementia Other     Social History Social History   Tobacco Use  . Smoking status: Never Smoker  . Smokeless tobacco: Never Used  Substance Use Topics  . Alcohol use: Yes  . Drug use: No     Allergies   Patient has no known allergies.   Review of Systems Review of Systems  Constitutional: Negative for fever.  Genitourinary: Negative for penile pain.  Skin: Negative for rash.     Physical Exam Updated Vital Signs BP 105/82   Pulse 64   Temp 98.6 F (37 C) (Oral)   Resp 18   SpO2 100%   Physical Exam  CONSTITUTIONAL: Well developed/well nourished HEAD: Normocephalic/atraumatic ENMT: Mucous membranes moist NECK: supple no meningeal signs NEURO: Pt is awake/alert/appropriate, moves all extremitiesx4.  No facial droop.  Patient is ambulatory EXTREMITIES:  full ROM SKIN:  color normal PSYCH: no abnormalities of mood noted, alert and oriented to situation  ED Treatments / Results  Labs (all labs ordered are listed, but only abnormal results are displayed) Labs Reviewed - No data to display  EKG None  Radiology No results found.  Procedures Procedures  Medications Ordered in ED Medications  penicillin g benzathine (BICILLIN  LA) 1200000 UNIT/2ML injection 2.4 Million Units (2.4 Million Units Intramuscular Given 09/24/17 0238)     Initial Impression / Assessment and Plan / ED Course  I have reviewed the triage vital signs and the nursing notes.     Patient recently found to have gonorrhea of anus.  Labs also confirm syphilis He has already been treated with Rocephin and azithromycin.  He will now require penicillin 2,400,000 units Advised him that he is to call his infectious disease doctor this week.  We discussed safe sex practices and the need to inform his partners Review of chart, it appears that his HIV is well  controlled  Final Clinical Impressions(s) / ED Diagnoses   Final diagnoses:  Syphilis    ED Discharge Orders    None       Zadie RhineWickline, Tnia Anglada, MD 09/24/17 [phone]

## 2018-08-31 IMAGING — CT CT CERVICAL SPINE W/O CM
4 of 7 series · 14 of 33 positions shown, 16 images · non-contrast
Comparison: None.

CLINICAL DATA: Pain after fall

EXAM:
CT HEAD WITHOUT CONTRAST
CT CERVICAL SPINE WITHOUT CONTRAST
TECHNIQUE: Multidetector CT imaging of the head and cervical spine was
performed following the standard protocol without intravenous
contrast. Multiplanar CT image reconstructions of the cervical spine
were also generated.

[Series 202: head w/o bone, idose (1) · axial · non-contrast · 0.44mm/px · z∈[+282,+362]mm · 3 of 66 slices shown]
[im 17/66  bone]
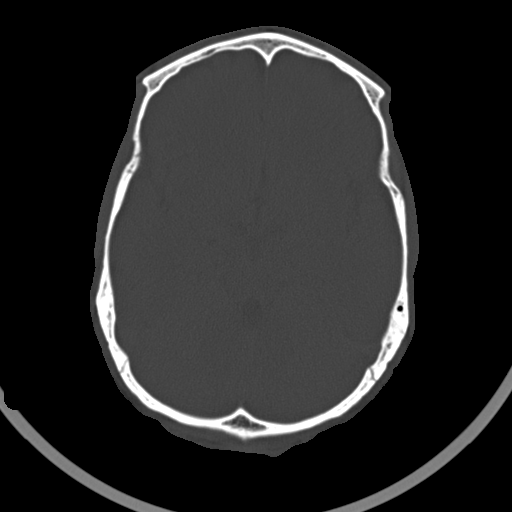
[im 33/66  bone]
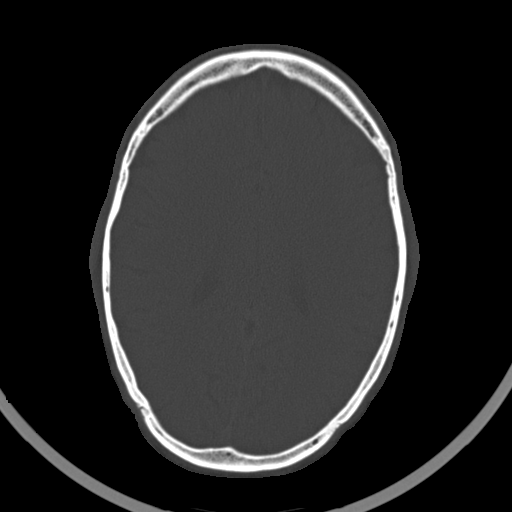
[im 49/66  bone]
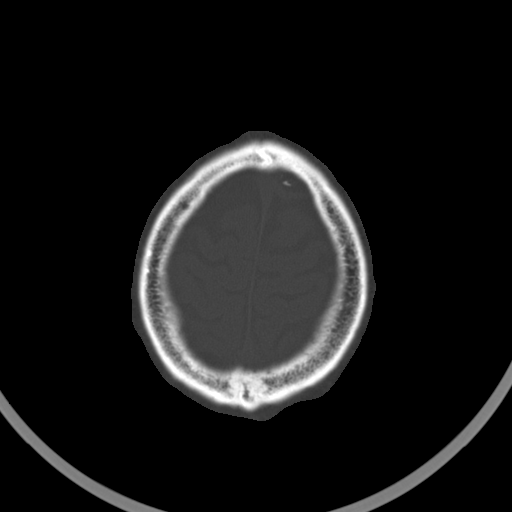

[Series 302: soft tissue, idose (2) · axial · 0.29mm/px · z∈[+118,+258]mm · 5 of 106 slices shown, 7 images]
[im 18/106  soft-tissue]
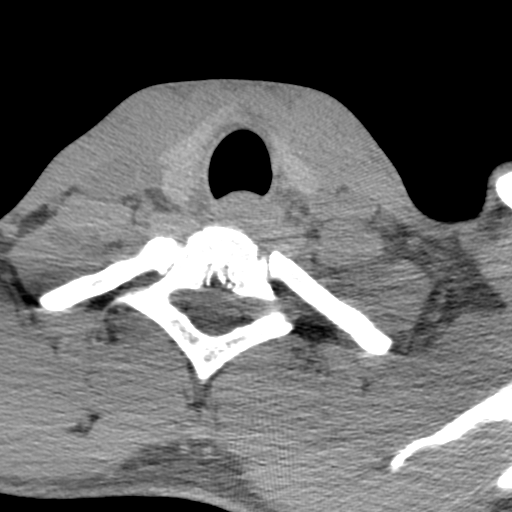
[im 18/106  bone]
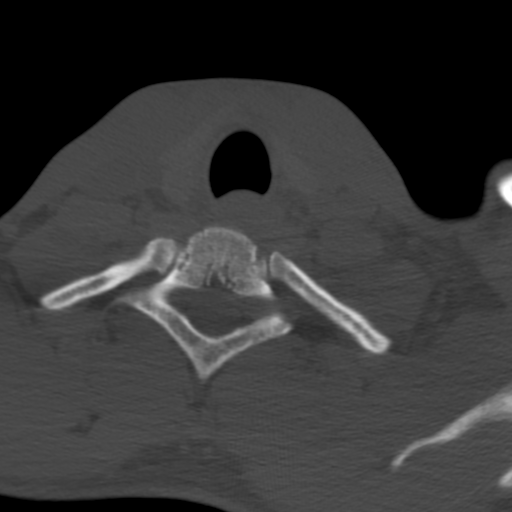
[im 36/106  bone]
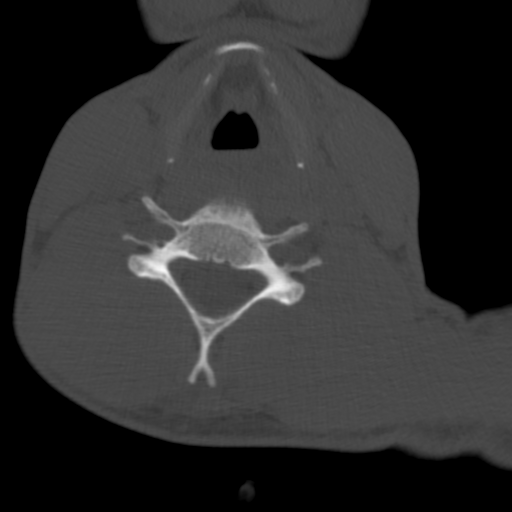
[im 53/106  bone]
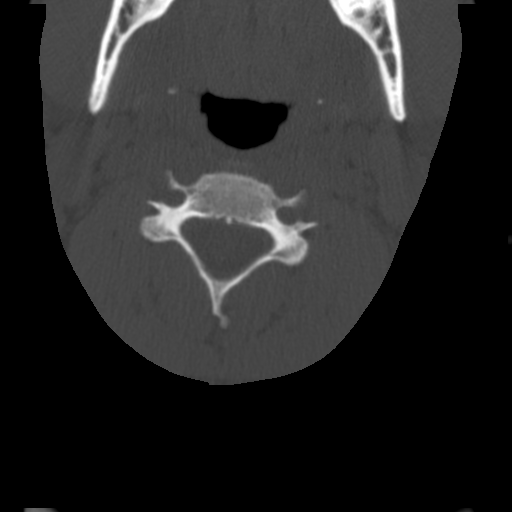
[im 71/106  bone]
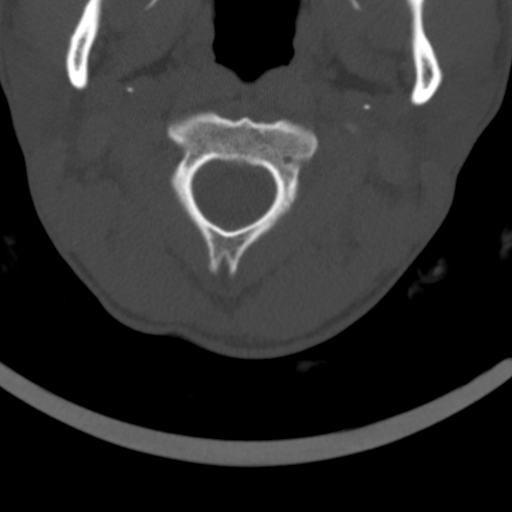
[im 88/106  soft-tissue]
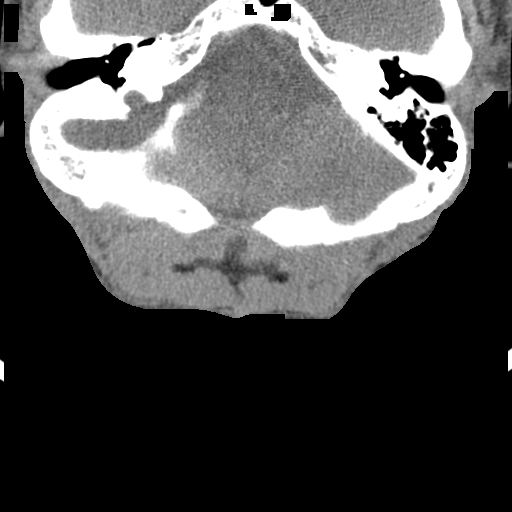
[im 88/106  bone]
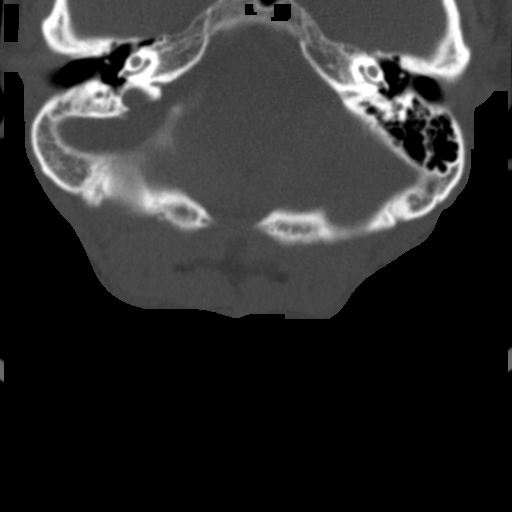

[Series 305: sagittal, idose (2) · sagittal · 0.30mm/px · 5 of 75 slices shown]
[im 13/75  bone]
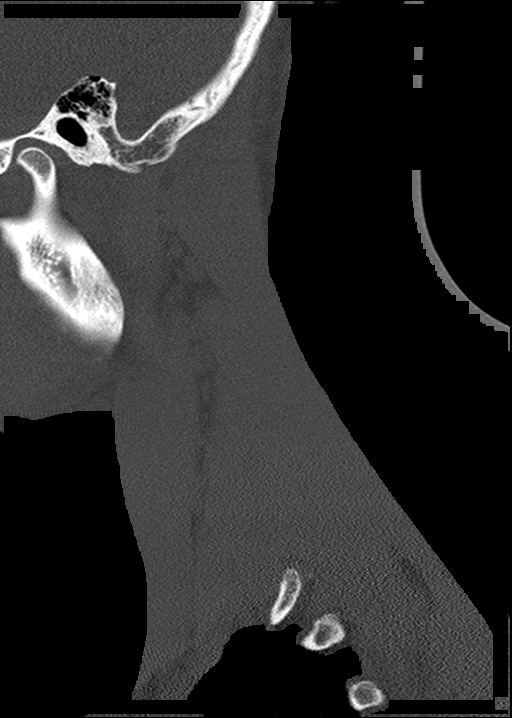
[im 25/75  bone]
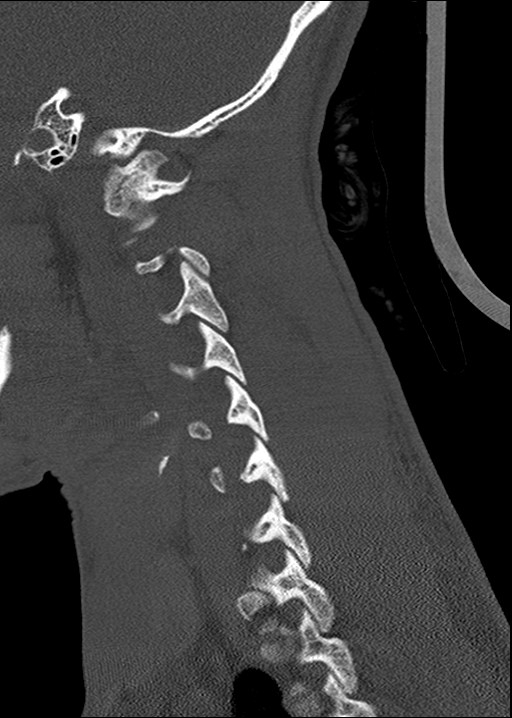
[im 38/75  bone]
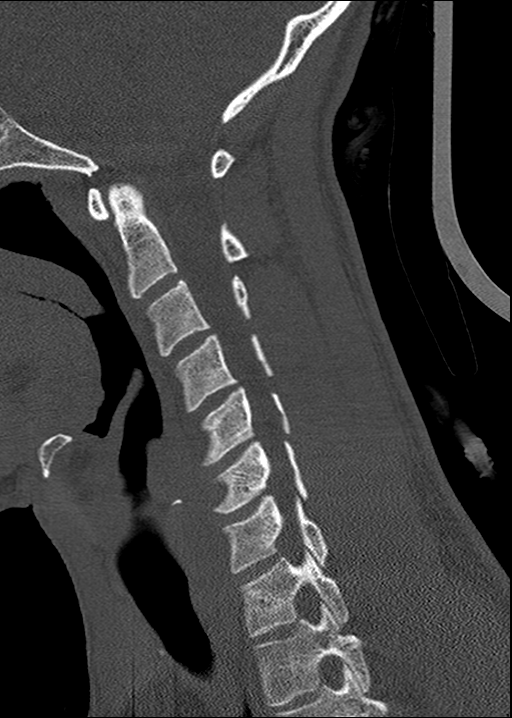
[im 50/75  bone]
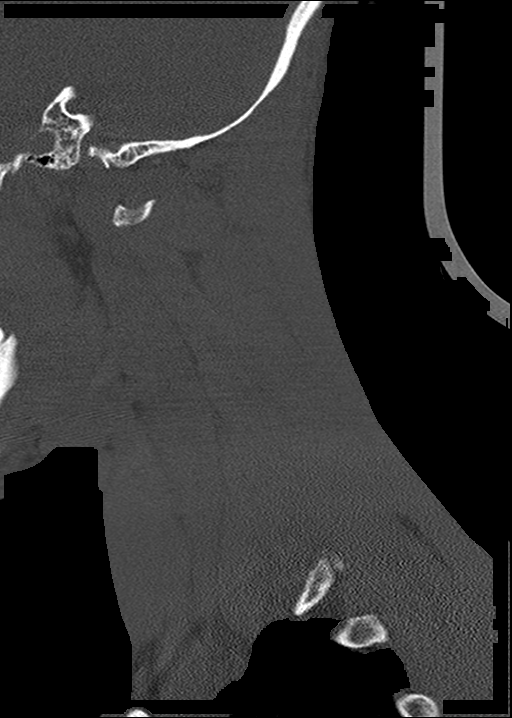
[im 62/75  bone]
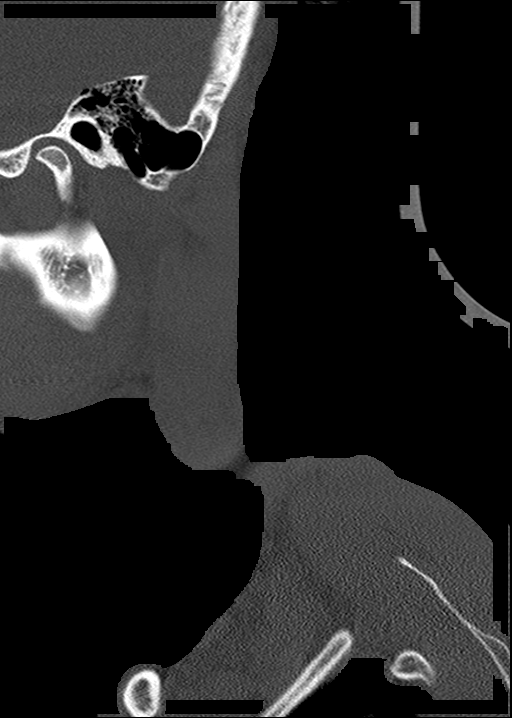

[Series 307: coronal, idose (2) · coronal · 0.34mm/px · 1 of 62 slices shown]
[im 31/62  bone]
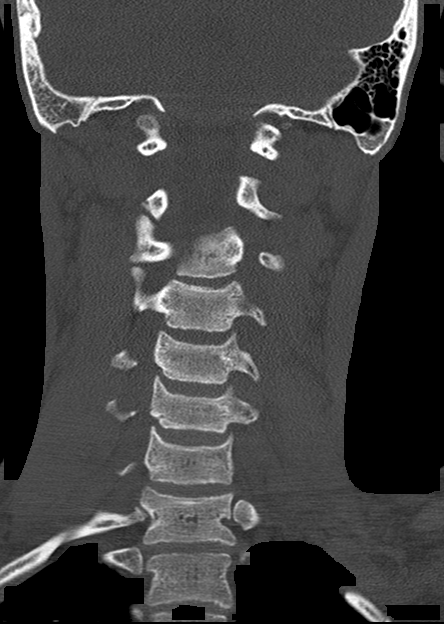

[14 of 33 positions shown; findings below may reference images not displayed]

FINDINGS: CT HEAD FINDINGS

BRAIN: The ventricles and sulci are normal. No intraparenchymal
hemorrhage, mass effect nor midline shift. No acute large vascular
territory infarcts. No abnormal extra-axial fluid collections. Basal
cisterns are midline and not effaced. No acute cerebellar
abnormality.

VASCULAR: Unremarkable.

SKULL/SOFT TISSUES: No skull fracture. No significant soft tissue
swelling.

ORBITS/SINUSES: The included ocular globes and orbital contents are
normal.The mastoid air-cells and included paranasal sinuses are
well-aerated.

OTHER: None.

CT CERVICAL SPINE FINDINGS

ALIGNMENT: Vertebral bodies in alignment. Slight reversal cervical
lordosis which reflect positioning or muscle spasm.

SKULL BASE AND VERTEBRAE: Cervical vertebral bodies and posterior
elements are intact. Intervertebral disc heights preserved. No
destructive bony lesions. C1-2 articulation maintained.

SOFT TISSUES AND SPINAL CANAL: Normal.

DISC LEVELS: No significant osseous canal stenosis or neural
foraminal narrowing.

UPPER CHEST: Lung apices are clear.

OTHER: None.
IMPRESSION: No acute intracranial abnormality. No acute posttraumatic cervical
spine fracture or subluxation.

## 2018-12-09 ENCOUNTER — Other Ambulatory Visit: Payer: Self-pay

## 2018-12-09 ENCOUNTER — Encounter (HOSPITAL_BASED_OUTPATIENT_CLINIC_OR_DEPARTMENT_OTHER): Payer: Self-pay

## 2018-12-09 ENCOUNTER — Emergency Department (HOSPITAL_BASED_OUTPATIENT_CLINIC_OR_DEPARTMENT_OTHER)
Admission: EM | Admit: 2018-12-09 | Discharge: 2018-12-09 | Disposition: A | Discharge status: Home / Self Care | Payer: Managed Care, Other (non HMO) | Attending: Emergency Medicine | Admitting: Emergency Medicine

## 2018-12-09 DIAGNOSIS — K5900 Constipation, unspecified: Secondary | ICD-10-CM | POA: Insufficient documentation

## 2018-12-09 HISTORY — DX: Ulcerative colitis, unspecified, without complications: K51.90

## 2018-12-09 MED ORDER — PENICILLIN G BENZATHINE & PROC 1200000 UNIT/2ML IM SUSP
2.4000 10*6.[IU] | Freq: Once | INTRAMUSCULAR | Status: AC
  Administered 2018-12-09: 2.4 10*6.[IU] via INTRAMUSCULAR
  Filled 2018-12-09: qty 4

## 2018-12-09 NOTE — ED Provider Notes (Signed)
[facility] EMERGENCY DEPARTMENT Provider Note   CSN: [id number] Arrival date & time: 12/09/18  2206     History   Chief Complaint Chief Complaint  Patient presents with  . Constipation    HPI Corey Cross is a 28 y.o. male.     28 yo M with a chief complaint of constipation and possible syphilis infection.  Patient states that he had a syphilis test that was done as an outpatient reportedly this was positive though they told him he needed a confirmatory test.  He was unwilling to have the confirmatory test drawn as they were also unable to treat him with antibiotics at that facility.  He tried to go to different emergency department but the wait was too long.  He has not followed up for this with his infectious disease physician.  He feels like he has some uncomfortableness with urination though he has difficulty describing this.  States again felt like the last time when he had syphilis.  He would like to be treated for it at this time.  He also has not had a significant bowel movement in a couple weeks.  He tried 1 scoop of MiraLAX in 8 ounces water without improvement and also tried one enema.  States he has had scant output and there has been some mucus.  He denies abdominal pain or vomiting.  The history is provided by the patient.  Constipation Severity:  Moderate Time since last bowel movement:  1 day Timing:  Constant Progression:  Worsening Chronicity:  New Stool description:  Loose and mucus containing Relieved by:  Nothing Worsened by:  Nothing Ineffective treatments:  None tried Associated symptoms: dysuria   Associated symptoms: no abdominal pain, no diarrhea, no fever and no vomiting     Past Medical History:  Diagnosis Date  . AIDS (HCC)   . Anal condylomata   . HIV positive (HCC)   . Rectal gonorrhea   . UC (ulcerative colitis) [facility](HCC)     Patient Active Problem List   Diagnosis Date Noted  . Alcohol abuse with intoxication (HCC) 04/16/2016   . Rectal abscess 11/03/2013  . Urinary retention 11/03/2013  . HIV disease (HCC) 11/03/2013    Past Surgical History:  Procedure Laterality Date  . INCISION AND DRAINAGE PERIRECTAL ABSCESS N/A 11/03/2013   Procedure: IRRIGATION AND DEBRIDEMENT PERIRECTAL ABSCESS;  Surgeon: Atilano InaEric M Wilson, MD;  Location: [facility]S;  Service: General;  Laterality: N/A;  . WARTS REMOVED          Home Medications    Prior to Admission medications   Medication Sig Start Date End Date Taking? Authorizing Provider  Bictegravir-Emtricitab-Tenofov (BIKTARVY PO) Take by mouth daily.    [provider]  dicyclomine (BENTYL) 20 MG tablet Take 1 tablet (20 mg total) by mouth 2 (two) times daily. 11/02/16   Rolan BuccoBelfi, Melanie, MD  docusate sodium (COLACE) 100 MG capsule Take 1 capsule (100 mg total) by mouth every 12 (twelve) hours. 11/02/16   Rolan BuccoBelfi, Melanie, MD  estradiol (ESTRACE) 2 MG tablet Take 2 mg by mouth daily.    [provider]  spironolactone (ALDACTONE) 50 MG tablet Take 50 mg by mouth 2 (two) times daily.    [provider]    Family History Family History  Problem Relation Age of Onset  . Cancer Mother   . Hypertension Father   . Cancer Other   . Diabetes Other   . Dementia Other     Social History Social History  Tobacco Use  . Smoking status: Never Smoker  . Smokeless tobacco: Never Used  Substance Use Topics  . Alcohol use: Yes    Comment: occ  . Drug use: No     Allergies   Patient has no known allergies.   Review of Systems Review of Systems  Constitutional: Negative for chills and fever.  HENT: Negative for congestion and facial swelling.   Eyes: Negative for discharge and visual disturbance.  Respiratory: Negative for shortness of breath.   Cardiovascular: Negative for chest pain and palpitations.  Gastrointestinal: Positive for constipation. Negative for abdominal pain, diarrhea and vomiting.  Genitourinary: Positive for dysuria.   Musculoskeletal: Negative for arthralgias and myalgias.  Skin: Negative for color change and rash.  Neurological: Negative for tremors, syncope and headaches.  Psychiatric/Behavioral: Negative for confusion and dysphoric mood.     Physical Exam Updated Vital Signs BP 123/89 (BP Location: Left Arm)   Pulse 76   Temp 98.8 F (37.1 C) (Oral)   Resp 18   Ht 5\' 10"  (1.778 m)   Wt 61.2 kg   SpO2 100%   BMI 19.37 kg/m   Physical Exam Vitals signs and nursing note reviewed.  Constitutional:      Appearance: He is well-developed.  HENT:     Head: Normocephalic and atraumatic.  Eyes:     Pupils: Pupils are equal, round, and reactive to light.  Neck:     Musculoskeletal: Normal range of motion and neck supple.     Vascular: No JVD.  Cardiovascular:     Rate and Rhythm: Normal rate and regular rhythm.     Heart sounds: No murmur. No friction rub. No gallop.   Pulmonary:     Effort: No respiratory distress.     Breath sounds: No wheezing.  Abdominal:     General: There is no distension.     Tenderness: There is no abdominal tenderness. There is no guarding or rebound.  Musculoskeletal: Normal range of motion.  Skin:    Coloration: Skin is not pale.     Findings: No rash.  Neurological:     Mental Status: He is alert and oriented to person, place, and time.  Psychiatric:        Behavior: Behavior normal.      ED Treatments / Results  Labs (all labs ordered are listed, but only abnormal results are displayed) Labs Reviewed - No data to display  EKG None  Radiology No results found.  Procedures Procedures (including critical care time)  Medications Ordered in ED Medications  penicillin g procaine-penicillin g benzathine (BICILLIN-CR) injection 600000-600000 units (2.4 Million Units Intramuscular Given 12/09/18 2245)     Initial Impression / Assessment and Plan / ED Course  I have reviewed the triage vital signs and the nursing notes.  Pertinent labs &  imaging results that were available during my care of the patient were reviewed by me and considered in my medical decision making (see chart for details).        28 yo M with a chief complaints of constipation and concern for syphilis infection.  Patient states that he was tested for syphilis as an outpatient he was positive but has not yet had a confirmatory test.  I offered to test him but he would rather just be treated at this time.  He states he was already tested for the other STDs and was negative.  He has been constipated for the past couple weeks has no abdominal pain or vomiting.  We will have him do a trial of MiraLAX at home.  PCP follow-up.  11:02 PM:  I have discussed the diagnosis/risks/treatment options with the patient and believe the pt to be eligible for discharge home to follow-up with PCP, ID. We also discussed returning to the ED immediately if new or worsening sx occur. We discussed the sx which are most concerning (e.g., sudden worsening pain, fever, inability to tolerate by mouth) that necessitate immediate return. Medications administered to the patient during their visit and any new prescriptions provided to the patient are listed below.  Medications given during this visit Medications  penicillin g procaine-penicillin g benzathine (BICILLIN-CR) injection 600000-600000 units (2.4 Million Units Intramuscular Given 12/09/18 2245)     The patient appears reasonably screen and/or stabilized for discharge and I doubt any other medical condition or other [facility] requiring further screening, evaluation, or treatment in the ED at this time prior to discharge.    Final Clinical Impressions(s) / ED Diagnoses   Final diagnoses:  Constipation, unspecified constipation type    ED Discharge Orders    None       Deno Etienne, DO 12/09/18 2302

## 2018-12-09 NOTE — ED Triage Notes (Signed)
Pt c/o constipation-reports last normal BM 2 weeks ago-no results with miralax and enema-NAD-steady gait

## 2018-12-09 NOTE — Discharge Instructions (Signed)
Take 8 scoops of miralax in 32oz of whatever you would like to drink.(Gatorade comes in this size) You can also use a fleets enema which you can buy over the counter at the pharmacy.  Return for worsening abdominal pain, vomiting or fever. ? ?

## 2019-03-21 IMAGING — CT CT ABD-PELV W/ CM
2 of 4 series · 16 of 46 positions shown, 18 images · IV contrast (APPLIED)
Comparison: 02/24/2014.

CLINICAL DATA: Lower abdominal pain, primarily on the right, for
the past 3 days. Constipation. One episode of vomiting last night.
HIV positive. History of rectal gonorrhea and perirectal abscess.

EXAM:
CT ABDOMEN AND PELVIS WITH CONTRAST
TECHNIQUE: Multidetector CT imaging of the abdomen and pelvis was performed
using the standard protocol following bolus administration of
intravenous contrast.
CONTRAST:  100mL M6KOP0-PCC IOPAMIDOL (M6KOP0-PCC) INJECTION 61%

[Series 2: axial st · axial · 0.71mm/px · z∈[-416,-56]mm · 13 of 82 slices shown, 15 images]
[im 7/82  soft-tissue]
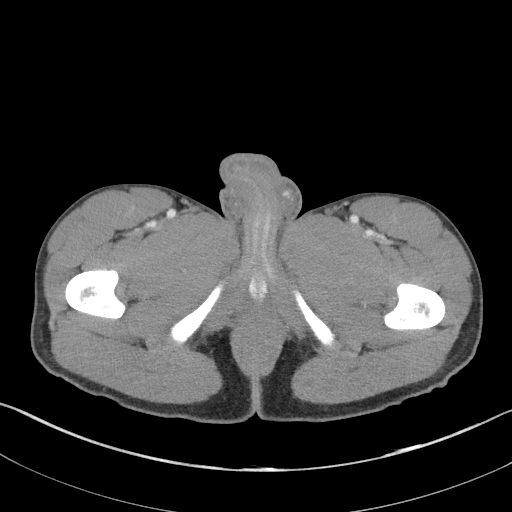
[im 7/82  bone]
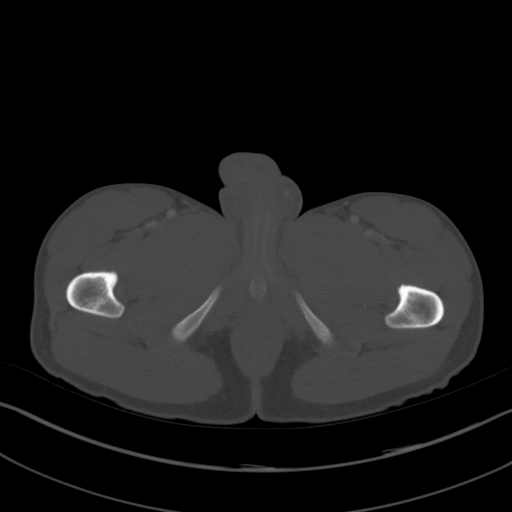
[im 13/82  soft-tissue]
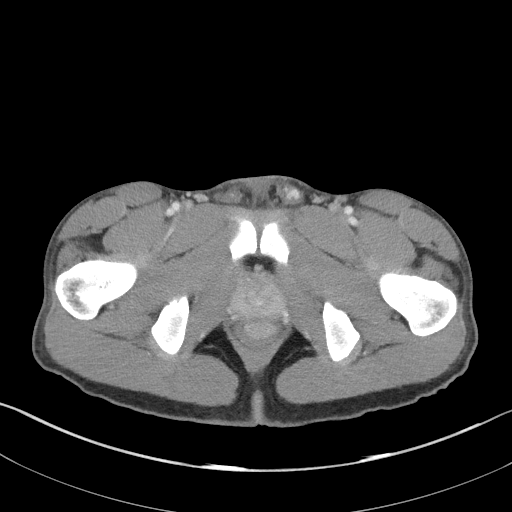
[im 19/82  soft-tissue]
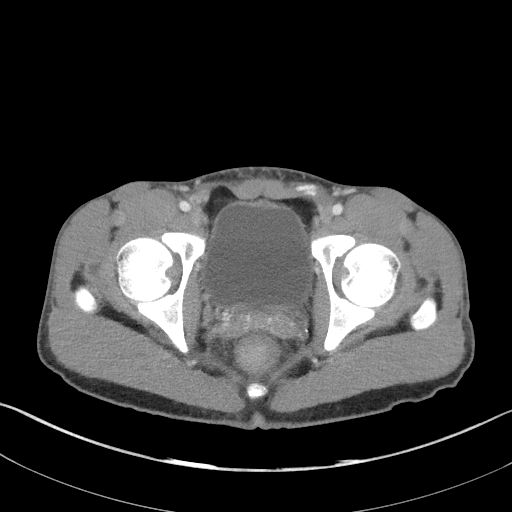
[im 25/82  soft-tissue]
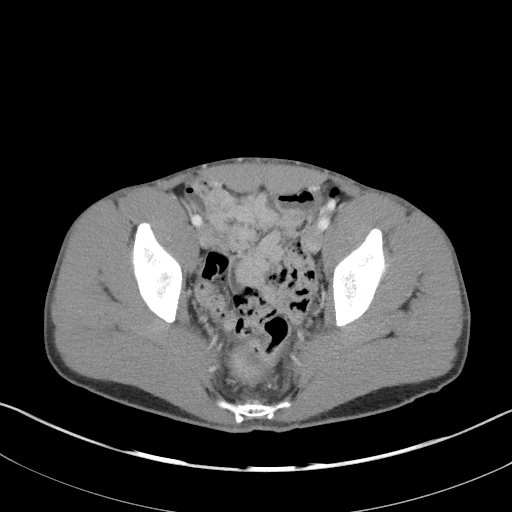
[im 31/82  soft-tissue]
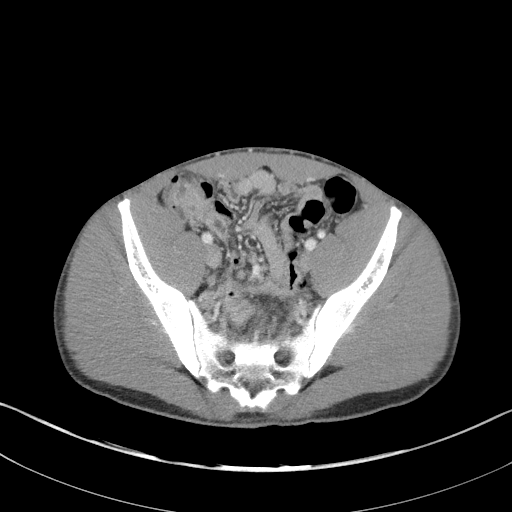
[im 37/82  soft-tissue]
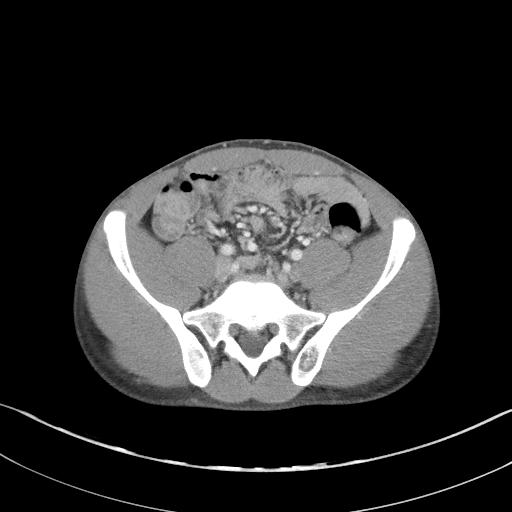
[im 43/82  soft-tissue]
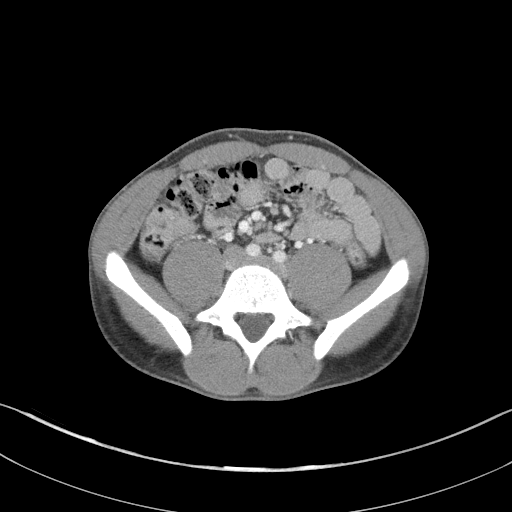
[im 49/82  soft-tissue]
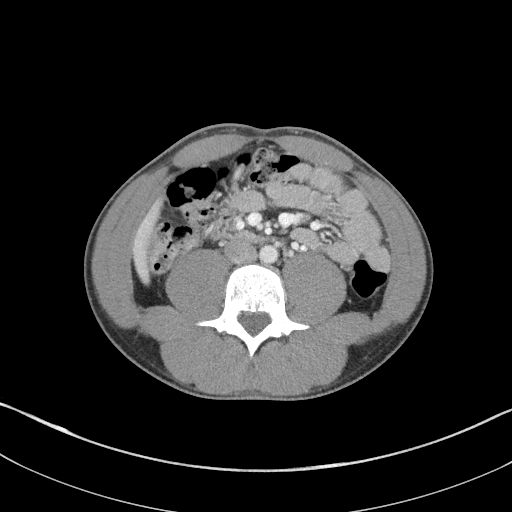
[im 55/82  soft-tissue]
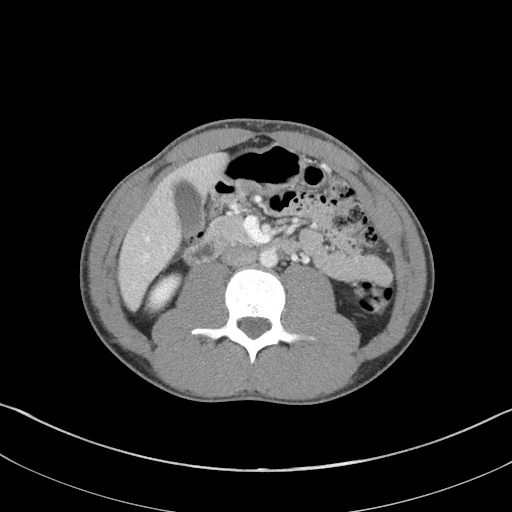
[im 55/82  bone]
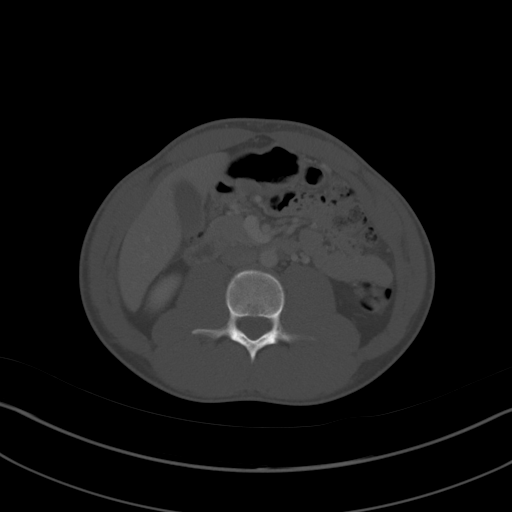
[im 61/82  soft-tissue]
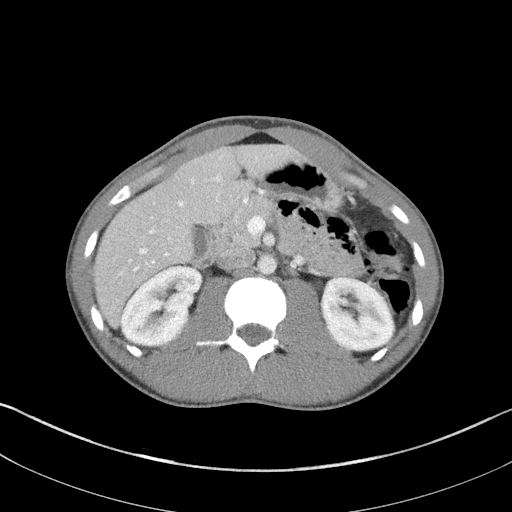
[im 67/82  soft-tissue]
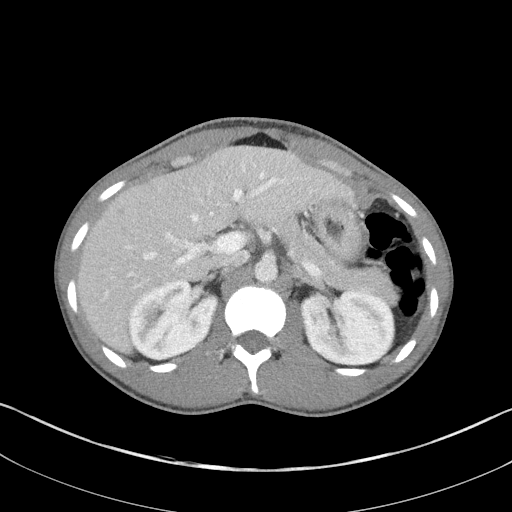
[im 73/82  soft-tissue]
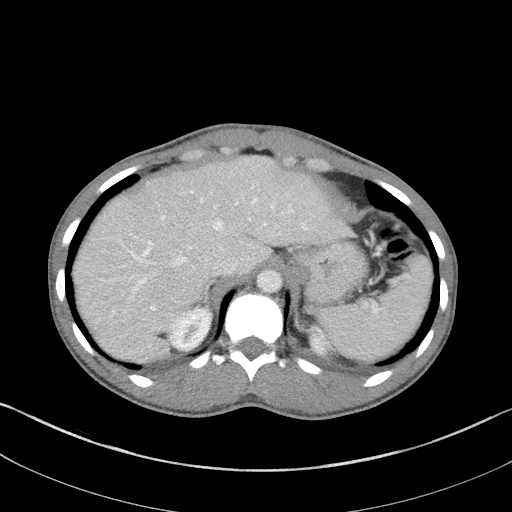
[im 79/82  soft-tissue]
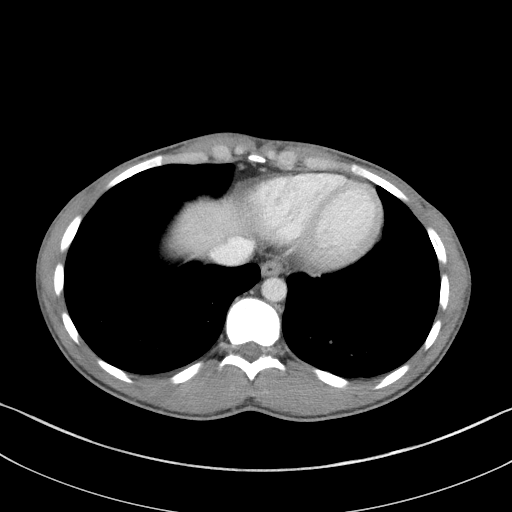

[Series 5: coronal st · coronal · 0.66mm/px · 3 of 74 slices shown]
[im 25/74  soft-tissue]
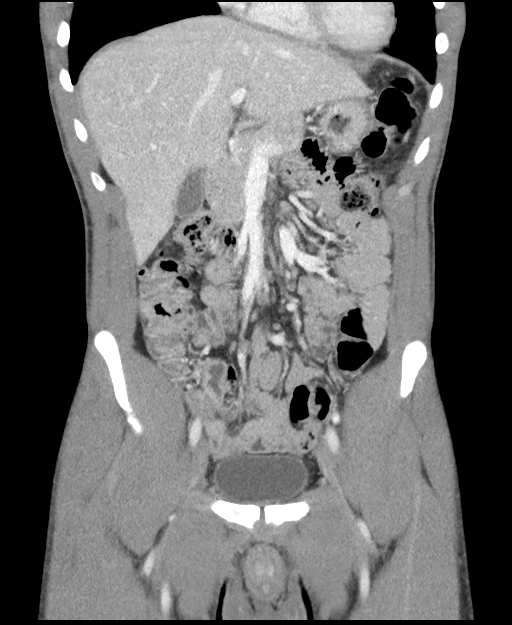
[im 33/74  soft-tissue]
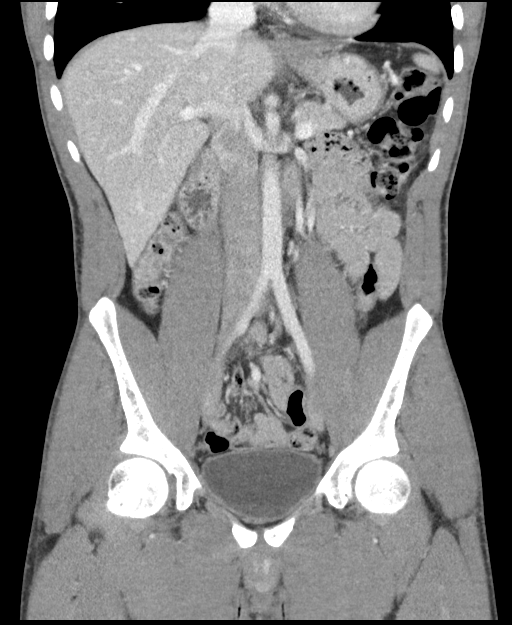
[im 41/74  soft-tissue]
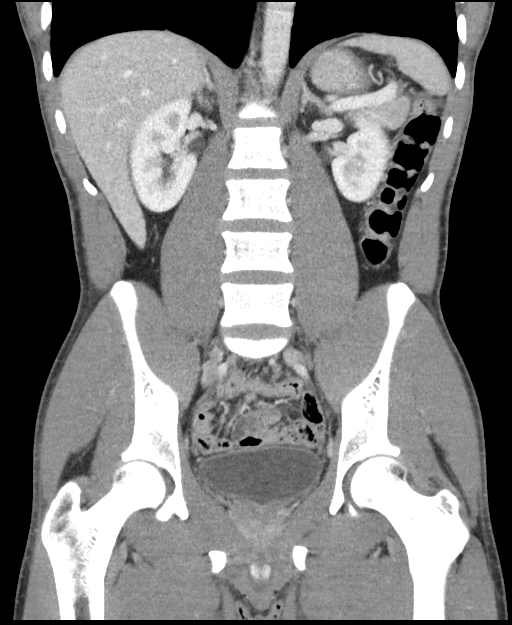

[16 of 46 positions shown; findings below may reference images not displayed]

FINDINGS: Lower chest: Minimal bilateral dependent atelectasis.

Hepatobiliary: 4 mm rounded area of low density in the right lobe of
the liver on image number 16 series 2, not significant change.
Unremarkable gallbladder.

Pancreas: Unremarkable. No pancreatic ductal dilatation or
surrounding inflammatory changes.

Spleen: Normal in size without focal abnormality.

Adrenals/Urinary Tract: Adrenal glands are unremarkable. Kidneys are
normal, without renal calculi, focal lesion, or hydronephrosis.
Bladder is unremarkable.

Stomach/Bowel: Unremarkable stomach, small bowel and colon. No
evidence of appendicitis.

Vascular/Lymphatic: No significant vascular findings are present. No
enlarged abdominal or pelvic lymph nodes.

Reproductive: Prostate is unremarkable.

Other: Minimal free peritoneal fluid.

Musculoskeletal: Unremarkable.
IMPRESSION: 1. No acute abnormality.
2. Stable 4 mm rounded gave low density in the right lobe of the
liver. The long-term stability is compatible with a benign process.

## 2019-09-30 ENCOUNTER — Encounter (HOSPITAL_BASED_OUTPATIENT_CLINIC_OR_DEPARTMENT_OTHER): Payer: Self-pay | Admitting: Emergency Medicine

## 2019-09-30 ENCOUNTER — Emergency Department (HOSPITAL_BASED_OUTPATIENT_CLINIC_OR_DEPARTMENT_OTHER)
Admission: EM | Admit: 2019-09-30 | Discharge: 2019-09-30 | Disposition: A | Discharge status: Home / Self Care | Payer: Managed Care, Other (non HMO) | Attending: Emergency Medicine | Admitting: Emergency Medicine

## 2019-09-30 ENCOUNTER — Other Ambulatory Visit: Payer: Self-pay

## 2019-09-30 DIAGNOSIS — B20 Human immunodeficiency virus [HIV] disease: Secondary | ICD-10-CM | POA: Insufficient documentation

## 2019-09-30 DIAGNOSIS — R21 Rash and other nonspecific skin eruption: Secondary | ICD-10-CM | POA: Insufficient documentation

## 2019-09-30 NOTE — Discharge Instructions (Signed)
Please read and follow all provided instructions.  Your diagnoses today include:  1. Rash     Tests performed today include:  Vital signs. See below for your results today.   RPR - syphilis test will return in 1-2 days  Medications prescribed:   Benadryl (diphenhydramine) - antihistamine  You can find this medication over-the-counter.   DO NOT exceed:   50mg  Benadryl every 6 hours    Benadryl will make you drowsy. DO NOT drive or perform any activities that require you to be awake and alert if taking this.  Take any prescribed medications only as directed.  Home care instructions:  Follow any educational materials contained in this packet.  BE VERY CAREFUL not to take multiple medicines containing Tylenol (also called acetaminophen). Doing so can lead to an overdose which can damage your liver and cause liver failure and possibly death.   Follow-up instructions: Please follow-up with your primary care provider in the next 3 days for further evaluation of your symptoms if not improved.    Return instructions:   Please return to the Emergency Department if you experience worsening symptoms.   Please return if you have any other emergent concerns.  Additional Information:  Your vital signs today were: BP 101/65 (BP Location: Left Arm)   Pulse 69   Temp 99.2 F (37.3 C) (Oral)   Resp 16   Ht 5\' 10"  (1.778 m)   Wt 63.5 kg   SpO2 100%   BMI 20.09 kg/m  If your blood pressure (BP) was elevated above 135/85 this visit, please have this repeated by your doctor within one month. --------------

## 2019-09-30 NOTE — ED Triage Notes (Signed)
Pt woke up with rash to arms, penis, legs and abd today. C/o itching.

## 2019-09-30 NOTE — ED Notes (Signed)
States he has a rash on his body for the past 2 days, not itching at this time, states now bumps are at his genitals and has some itching. States he put coconut oil on the areas. Had some relief. States issue is more at his testicles

## 2019-09-30 NOTE — ED Provider Notes (Signed)
[facility] EMERGENCY DEPARTMENT Provider Note   CSN: [id number] Arrival date & time: 09/30/19  1734     History Chief Complaint  Patient presents with  . Rash    Corey Cross is a 29 y.o. male.  Patient with history of HIV presents the emergency department today for several small areas of itching on his scrotum and penis starting today.  Denies new soaps, detergents, skin contacts.  States that he has been wearing underwear the past few days which is not typical.  Has applied coconut oil.  No other treatments.  He was concerned about an STI.  Patient also states that he was told he was exposed to syphilis 4 months ago.  He had a syphilis test that was negative, was told to get a repeat one in 3 months which she has not done.  This is one of his concerns today.  Otherwise denies penile discharge.  No anaphylaxis symptoms.        Past Medical History:  Diagnosis Date  . AIDS (HCC)   . Anal condylomata   . HIV positive (HCC)   . Rectal gonorrhea   . UC (ulcerative colitis) [facility])     Patient Active Problem List   Diagnosis Date Noted  . Alcohol abuse with intoxication (HCC) 04/16/2016  . Rectal abscess 11/03/2013  . Urinary retention 11/03/2013  . HIV disease (HCC) 11/03/2013    Past Surgical History:  Procedure Laterality Date  . INCISION AND DRAINAGE PERIRECTAL ABSCESS N/A 11/03/2013   Procedure: IRRIGATION AND DEBRIDEMENT PERIRECTAL ABSCESS;  Surgeon: Atilano Ina, MD;  Location: [facility] ORS;  Service: General;  Laterality: N/A;  . WARTS REMOVED         Family History  Problem Relation Age of Onset  . Cancer Mother   . Hypertension Father   . Cancer Other   . Diabetes Other   . Dementia Other     Social History   Tobacco Use  . Smoking status: Never Smoker  . Smokeless tobacco: Never Used  Vaping Use  . Vaping Use: Never used  Substance Use Topics  . Alcohol use: Yes    Comment: occ  . Drug use: No    Home Medications Prior to Admission  medications   Medication Sig Start Date End Date Taking? Authorizing Provider  Bictegravir-Emtricitab-Tenofov (BIKTARVY PO) Take by mouth daily.    [provider]  dicyclomine (BENTYL) 20 MG tablet Take 1 tablet (20 mg total) by mouth 2 (two) times daily. 11/02/16   Rolan Bucco, MD  docusate sodium (COLACE) 100 MG capsule Take 1 capsule (100 mg total) by mouth every 12 (twelve) hours. 11/02/16   Rolan Bucco, MD  estradiol (ESTRACE) 2 MG tablet Take 2 mg by mouth daily.    [provider]  spironolactone (ALDACTONE) 50 MG tablet Take 50 mg by mouth 2 (two) times daily.    [provider]    Allergies    Patient has no known allergies.  Review of Systems   Review of Systems  Constitutional: Negative for fever.  HENT: Negative for facial swelling, sore throat and trouble swallowing.   Eyes: Negative for discharge and redness.  Respiratory: Negative for shortness of breath, wheezing and stridor.   Cardiovascular: Negative for chest pain.  Gastrointestinal: Negative for nausea, rectal pain and vomiting.  Genitourinary: Positive for genital sores. Negative for discharge, dysuria, frequency, penile pain and testicular pain.  Musculoskeletal: Negative for arthralgias and myalgias.  Skin: Negative for rash.  Neurological:  Negative for light-headedness.  Hematological: Negative for adenopathy.  Psychiatric/Behavioral: Negative for confusion.    Physical Exam Updated Vital Signs BP 101/65 (BP Location: Left Arm)   Pulse 69   Temp 99.2 F (37.3 C) (Oral)   Resp 16   Ht 5\' 10"  (1.778 m)   Wt 63.5 kg   SpO2 100%   BMI 20.09 kg/m   Physical Exam Vitals and nursing note reviewed.  Constitutional:      Appearance: He is well-developed.  HENT:     Head: Normocephalic and atraumatic.  Eyes:     Conjunctiva/sclera: Conjunctivae normal.  Pulmonary:     Effort: No respiratory distress.  Genitourinary:    Comments: Small red papule noted on penile shaft  and mild erythema of the scrotum.  No signs of cellulitis.  No signs of abscess.  No urticaria. Musculoskeletal:     Cervical back: Normal range of motion and neck supple.  Skin:    General: Skin is warm and dry.  Neurological:     Mental Status: He is alert.     ED Results / Procedures / Treatments   Labs (all labs ordered are listed, but only abnormal results are displayed) Labs Reviewed  RPR    EKG None  Radiology No results found.  Procedures Procedures (including critical care time)  Medications Ordered in ED Medications - No data to display  ED Course  I have reviewed the triage vital signs and the nursing notes.  Pertinent labs & imaging results that were available during my care of the patient were reviewed by me and considered in my medical decision making (see chart for details).  Patient seen and examined.  Patient with mild skin irritation.  He is concerned about previous syphilis test and requests RPR performed today.  Vital signs reviewed and are as follows: BP 101/65 (BP Location: Left Arm)   Pulse 69   Temp 99.2 F (37.3 C) (Oral)   Resp 16   Ht 5\' 10"  (1.778 m)   Wt 63.5 kg   SpO2 100%   BMI 20.09 kg/m   Counseled on use of antihistamines as needed for itching.  Otherwise close monitoring.  Encouraged return with worsening redness or pain in these areas.    MDM Rules/Calculators/A&P                          Patient with minimal groin rash.  Nonspecific.  No signs of severe allergic reaction.  No signs of cellulitis/abscess.   Final Clinical Impression(s) / ED Diagnoses Final diagnoses:  Rash    Rx / DC Orders ED Discharge Orders    None       , PA-C 09/30/19 1840    Renne Crigler, MD 10/06/19 1452

## 2019-10-01 LAB — RPR
RPR Ser Ql: REACTIVE — AB
RPR Titer: 1:64 {titer}

## 2019-10-03 LAB — T.PALLIDUM AB, TOTAL: T Pallidum Abs: REACTIVE — AB

## 2019-10-04 ENCOUNTER — Encounter (HOSPITAL_BASED_OUTPATIENT_CLINIC_OR_DEPARTMENT_OTHER): Payer: Self-pay | Admitting: *Deleted

## 2019-10-04 ENCOUNTER — Emergency Department (HOSPITAL_BASED_OUTPATIENT_CLINIC_OR_DEPARTMENT_OTHER)
Admission: EM | Admit: 2019-10-04 | Discharge: 2019-10-05 | Disposition: A | Discharge status: Home / Self Care | Payer: Self-pay | Attending: Emergency Medicine | Admitting: Emergency Medicine

## 2019-10-04 ENCOUNTER — Other Ambulatory Visit: Payer: Self-pay

## 2019-10-04 DIAGNOSIS — Z21 Asymptomatic human immunodeficiency virus [HIV] infection status: Secondary | ICD-10-CM | POA: Insufficient documentation

## 2019-10-04 DIAGNOSIS — Z202 Contact with and (suspected) exposure to infections with a predominantly sexual mode of transmission: Secondary | ICD-10-CM | POA: Insufficient documentation

## 2019-10-04 DIAGNOSIS — A539 Syphilis, unspecified: Secondary | ICD-10-CM

## 2019-10-04 DIAGNOSIS — Z79899 Other long term (current) drug therapy: Secondary | ICD-10-CM | POA: Insufficient documentation

## 2019-10-04 LAB — URINALYSIS, ROUTINE W REFLEX MICROSCOPIC
Bilirubin Urine: NEGATIVE
Glucose, UA: NEGATIVE mg/dL
Hgb urine dipstick: NEGATIVE
Ketones, ur: NEGATIVE mg/dL
Leukocytes,Ua: NEGATIVE
Nitrite: NEGATIVE
Protein, ur: NEGATIVE mg/dL
Specific Gravity, Urine: 1.015 (ref 1.005–1.030)
pH: 8 (ref 5.0–8.0)

## 2019-10-04 NOTE — ED Notes (Signed)
Here this past Thursday, having rashes all over body, denies any fevers. Denies any drainage from his penis. States he has an open sore on his scrotum, was told he had an allergic reaction.

## 2019-10-04 NOTE — ED Triage Notes (Signed)
Syphilis Dx needs TX

## 2019-10-05 ENCOUNTER — Encounter (HOSPITAL_BASED_OUTPATIENT_CLINIC_OR_DEPARTMENT_OTHER): Payer: Self-pay | Admitting: Emergency Medicine

## 2019-10-05 MED ORDER — PENICILLIN G BENZATHINE 1200000 UNIT/2ML IM SUSP
2.4000 10*6.[IU] | Freq: Once | INTRAMUSCULAR | Status: AC
  Administered 2019-10-05: 2.4 10*6.[IU] via INTRAMUSCULAR
  Filled 2019-10-05: qty 4

## 2019-10-05 NOTE — ED Notes (Signed)
PCN IM injections given in equal amts in the Left hip and Right hip

## 2019-10-05 NOTE — ED Provider Notes (Signed)
[facility] EMERGENCY DEPARTMENT Provider Note   CSN: [id number] Arrival date & time: 10/04/19  1849     History Chief Complaint  Patient presents with  . STD TX    Corey Cross is a 29 y.o. male.  The history is provided by the patient.  Exposure to STD This is a recurrent problem. The current episode started more than 1 week ago. The problem occurs constantly. The problem has not changed since onset.Pertinent negatives include no chest pain, no abdominal pain, no headaches and no shortness of breath. Nothing aggravates the symptoms. Nothing relieves the symptoms. He has tried nothing for the symptoms. The treatment provided no relief.  Patient with HIV and h/o STIs who presents for treatment of positive syphilis from 8/26.  Was seen for a rash.      Past Medical History:  Diagnosis Date  . AIDS (HCC)   . Anal condylomata   . HIV positive (HCC)   . Rectal gonorrhea   . UC (ulcerative colitis) [facility])     Patient Active Problem List   Diagnosis Date Noted  . Alcohol abuse with intoxication (HCC) 04/16/2016  . Rectal abscess 11/03/2013  . Urinary retention 11/03/2013  . HIV disease (HCC) 11/03/2013    Past Surgical History:  Procedure Laterality Date  . INCISION AND DRAINAGE PERIRECTAL ABSCESS N/A 11/03/2013   Procedure: IRRIGATION AND DEBRIDEMENT PERIRECTAL ABSCESS;  Surgeon: Atilano Ina, MD;  Location: [facility] ORS;  Service: General;  Laterality: N/A;  . WARTS REMOVED         Family History  Problem Relation Age of Onset  . Cancer Mother   . Hypertension Father   . Cancer Other   . Diabetes Other   . Dementia Other     Social History   Tobacco Use  . Smoking status: Never Smoker  . Smokeless tobacco: Never Used  Vaping Use  . Vaping Use: Never used  Substance Use Topics  . Alcohol use: Yes    Comment: occ  . Drug use: No    Home Medications Prior to Admission medications   Medication Sig Start Date End Date Taking? Authorizing Provider   Bictegravir-Emtricitab-Tenofov (BIKTARVY PO) Take by mouth daily.    [provider]  dicyclomine (BENTYL) 20 MG tablet Take 1 tablet (20 mg total) by mouth 2 (two) times daily. 11/02/16   Rolan Bucco, MD  docusate sodium (COLACE) 100 MG capsule Take 1 capsule (100 mg total) by mouth every 12 (twelve) hours. 11/02/16   Rolan Bucco, MD  estradiol (ESTRACE) 2 MG tablet Take 2 mg by mouth daily.    [provider]  spironolactone (ALDACTONE) 50 MG tablet Take 50 mg by mouth 2 (two) times daily.    [provider]    Allergies    Patient has no known allergies.  Review of Systems   Review of Systems  Constitutional: Negative for fever.  HENT: Negative for congestion.   Eyes: Negative for visual disturbance.  Respiratory: Negative for shortness of breath.   Cardiovascular: Negative for chest pain.  Gastrointestinal: Negative for abdominal pain.  Genitourinary: Negative for difficulty urinating.  Musculoskeletal: Negative for arthralgias.  Skin: Negative for wound.  Neurological: Negative for headaches.  Psychiatric/Behavioral: Negative for agitation.  All other systems reviewed and are negative.   Physical Exam Updated Vital Signs BP 102/60 (BP Location: Right Arm)   Pulse 68   Temp 98.6 F (37 C) (Oral)   Resp 19   Ht 5\' 10"  (1.778 m)  Wt 61.2 kg   SpO2 100%   BMI 19.37 kg/m   Physical Exam Vitals and nursing note reviewed.  Constitutional:      General: He is not in acute distress.    Appearance: Normal appearance.  HENT:     Head: Normocephalic and atraumatic.     Nose: Nose normal.  Eyes:     Conjunctiva/sclera: Conjunctivae normal.     Pupils: Pupils are equal, round, and reactive to light.  Cardiovascular:     Rate and Rhythm: Normal rate and regular rhythm.     Pulses: Normal pulses.     Heart sounds: Normal heart sounds.  Pulmonary:     Effort: Pulmonary effort is normal.     Breath sounds: Normal breath sounds.  Abdominal:      General: Abdomen is flat. Bowel sounds are normal.     Palpations: Abdomen is soft.     Tenderness: There is no abdominal tenderness. There is no guarding.  Musculoskeletal:        General: Normal range of motion.     Cervical back: Normal range of motion and neck supple.  Skin:    General: Skin is warm and dry.     Capillary Refill: Capillary refill takes less than 2 seconds.  Neurological:     General: No focal deficit present.     Mental Status: He is alert and oriented to person, place, and time.     Deep Tendon Reflexes: Reflexes normal.  Psychiatric:        Mood and Affect: Mood normal.        Behavior: Behavior normal.     ED Results / Procedures / Treatments   Labs (all labs ordered are listed, but only abnormal results are displayed) Results for orders placed or performed during the hospital encounter of 10/04/19  Urinalysis, Routine w reflex microscopic Urine, Clean Catch  Result Value Ref Range   Color, Urine YELLOW YELLOW   APPearance CLOUDY (A) CLEAR   Specific Gravity, Urine 1.015 1.005 - 1.030   pH 8.0 5.0 - 8.0   Glucose, UA NEGATIVE NEGATIVE mg/dL   Hgb urine dipstick NEGATIVE NEGATIVE   Bilirubin Urine NEGATIVE NEGATIVE   Ketones, ur NEGATIVE NEGATIVE mg/dL   Protein, ur NEGATIVE NEGATIVE mg/dL   Nitrite NEGATIVE NEGATIVE   Leukocytes,Ua NEGATIVE NEGATIVE   No results found.  Radiology No results found.  Procedures Procedures (including critical care time)  Medications Ordered in ED Medications  penicillin g benzathine (BICILLIN LA) 1200000 UNIT/2ML injection 2.4 Million Units (has no administration in time range)    ED Course  I have reviewed the triage vital signs and the nursing notes.  Pertinent labs & imaging results that were available during my care of the patient were reviewed by me and considered in my medical decision making (see chart for details).    treated in the ED, inform your partners.  Follow up with the county health  department.    Corey Cross was evaluated in Emergency Department on 10/05/2019 for the symptoms described in the history of present illness. He was evaluated in the context of the global COVID-19 pandemic, which necessitated consideration that the patient might be at risk for infection with the SARS-CoV-2 virus that causes COVID-19. Institutional protocols and algorithms that pertain to the evaluation of patients at risk for COVID-19 are in a state of rapid change based on information released by regulatory bodies including the CDC and federal and state organizations. These policies and algorithms  were followed during the patient's care in the ED.   Final Clinical Impression(s) / ED Diagnoses Return for intractable cough, coughing up blood,fevers >100.4 unrelieved by medication, shortness of breath, intractable vomiting, chest pain, shortness of breath, weakness,numbness, changes in speech, facial asymmetry,abdominal pain, passing out,Inability to tolerate liquids or food, cough, altered mental status or any concerns. No signs of systemic illness or infection. The patient is nontoxic-appearing on exam and vital signs are within normal limits.   I have reviewed the triage vital signs and the nursing notes. Pertinent labs &imaging results that were available during my care of the patient were reviewed by me and considered in my medical decision making (see chart for details).After history, exam, and medical workup I feel the patient has beenappropriately medically screened and is safe for discharge home. Pertinent diagnoses were discussed with the patient. Patient was given return precautions.     Jacalyn Biggs, MD 10/05/19 0009

## 2019-10-06 LAB — GC/CHLAMYDIA PROBE AMP (~~LOC~~) NOT AT ARMC
Chlamydia: NEGATIVE
Comment: NEGATIVE
Comment: NORMAL
Neisseria Gonorrhea: NEGATIVE

## 2021-07-10 ENCOUNTER — Other Ambulatory Visit: Payer: Self-pay

## 2021-07-10 ENCOUNTER — Encounter (HOSPITAL_BASED_OUTPATIENT_CLINIC_OR_DEPARTMENT_OTHER): Payer: Self-pay | Admitting: Urology

## 2021-07-10 ENCOUNTER — Emergency Department (HOSPITAL_BASED_OUTPATIENT_CLINIC_OR_DEPARTMENT_OTHER)
Admission: EM | Admit: 2021-07-10 | Discharge: 2021-07-10 | Disposition: A | Discharge status: Home / Self Care | Payer: Self-pay | Attending: Emergency Medicine | Admitting: Emergency Medicine

## 2021-07-10 DIAGNOSIS — R369 Urethral discharge, unspecified: Secondary | ICD-10-CM

## 2021-07-10 DIAGNOSIS — R360 Urethral discharge without blood: Secondary | ICD-10-CM | POA: Insufficient documentation

## 2021-07-10 DIAGNOSIS — Z202 Contact with and (suspected) exposure to infections with a predominantly sexual mode of transmission: Secondary | ICD-10-CM | POA: Insufficient documentation

## 2021-07-10 LAB — URINALYSIS, ROUTINE W REFLEX MICROSCOPIC
Bilirubin Urine: NEGATIVE
Glucose, UA: NEGATIVE mg/dL
Hgb urine dipstick: NEGATIVE
Ketones, ur: NEGATIVE mg/dL
Nitrite: NEGATIVE
Protein, ur: NEGATIVE mg/dL
Specific Gravity, Urine: 1.015 (ref 1.005–1.030)
pH: 7 (ref 5.0–8.0)

## 2021-07-10 LAB — URINALYSIS, MICROSCOPIC (REFLEX): RBC / HPF: NONE SEEN RBC/hpf (ref 0–5)

## 2021-07-10 MED ORDER — CEFTRIAXONE SODIUM 500 MG IJ SOLR
500.0000 mg | Freq: Once | INTRAMUSCULAR | Status: AC
Start: 2021-07-10 — End: 2021-07-10
  Administered 2021-07-10: 500 mg via INTRAMUSCULAR
  Filled 2021-07-10: qty 500

## 2021-07-10 MED ORDER — DOXYCYCLINE HYCLATE 100 MG PO CAPS: 100.0000 mg | ORAL_CAPSULE | Freq: Two times a day (BID) | ORAL | 0 refills | Status: AC

## 2021-07-10 NOTE — Discharge Instructions (Addendum)
Please return to the ED if any new symptoms such as testicular pain, fevers Please follow-up on the results of your testing here today on MyChart.  If the results of your testing are negative, you may discontinue taking doxycycline. If the results of your test are positive, please do not engage in sexual activity until you have taken full course of doxycycline Please make all attempts to use condoms in the future.  Please ensure you are making your partners aware of all diagnoses you may have.

## 2021-07-10 NOTE — ED Provider Notes (Signed)
[facility] EMERGENCY DEPARTMENT Provider Note   CSN: [id number] Arrival date & time: 07/10/21  1710     History  Chief Complaint  Patient presents with   Exposure to STD    Corey Cross is a 31 y.o. male with medical history of AIDS, rectal gonorrhea, ulcerative colitis.  Patient presents to the ED for evaluation of penile discharge.  Patient states that 1 week ago he received a text message from a recent sexual partner stating that she had discharge.  The patient's sexual partner stated that she had gone to an ER and was provided with a shot.  The patient states that he is unsure if this partner ever received formal testing or formal diagnosis.  Patient states that 1 week ago he noticed white discharge from his penis.  The patient is also endorsing burning with urination.  Patient denies any testicular pain, fevers.  Patient also has secondary complaint of mucus in stool.  Patient states this occurred 1 time 3 days ago, the patient has a history of ulcerative colitis.  The patient is currently not taking any immune modulating drugs for this.  The patient states that he has been seen by GI in the past however has not seen them in quite some time.  The patient denies any blood in stool.   Exposure to STD      Home Medications Prior to Admission medications   Medication Sig Start Date End Date Taking? Authorizing Provider  doxycycline (VIBRAMYCIN) 100 MG capsule Take 1 capsule (100 mg total) by mouth 2 (two) times daily. 07/10/21  Yes Al Decant, PA-C  Bictegravir-Emtricitab-Tenofov (BIKTARVY PO) Take by mouth daily.    [provider]  dicyclomine (BENTYL) 20 MG tablet Take 1 tablet (20 mg total) by mouth 2 (two) times daily. 11/02/16   Rolan Bucco, MD  docusate sodium (COLACE) 100 MG capsule Take 1 capsule (100 mg total) by mouth every 12 (twelve) hours. 11/02/16   Rolan Bucco, MD  estradiol (ESTRACE) 2 MG tablet Take 2 mg by mouth daily.    [provider]  spironolactone (ALDACTONE) 50 MG tablet Take 50 mg by mouth 2 (two) times daily.    [provider]      Allergies    Patient has no known allergies.    Review of Systems   Review of Systems  Constitutional:  Negative for fever.  Gastrointestinal:  Negative for blood in stool.  Genitourinary:  Positive for dysuria and penile discharge. Negative for testicular pain.  All other systems reviewed and are negative.  Physical Exam Updated Vital Signs BP 120/84 (BP Location: Right Arm)   Pulse 79   Temp 99.3 F (37.4 C) (Oral)   Resp 16   Ht 5\' 10"  (1.778 m)   Wt 61.2 kg   SpO2 98%   BMI 19.37 kg/m  Physical Exam Vitals and nursing note reviewed. Exam conducted with a chaperone present.  Constitutional:      General: He is not in acute distress.    Appearance: Normal appearance. He is not ill-appearing, toxic-appearing or diaphoretic.  HENT:     Head: Normocephalic and atraumatic.     Nose: Nose normal. No congestion.     Mouth/Throat:     Mouth: Mucous membranes are moist.     Pharynx: Oropharynx is clear.  Eyes:     Extraocular Movements: Extraocular movements intact.     Conjunctiva/sclera: Conjunctivae normal.     Pupils: Pupils are equal, round, and  reactive to light.  Cardiovascular:     Rate and Rhythm: Normal rate and regular rhythm.  Pulmonary:     Effort: Pulmonary effort is normal.     Breath sounds: Normal breath sounds. No wheezing.  Abdominal:     General: Abdomen is flat. Bowel sounds are normal.     Palpations: Abdomen is soft.  Genitourinary:    Testes: Normal. Cremasteric reflex is present.     Tanner stage (genital): 5.     Comments: Chaperone present.  No appreciable penile discharge noted.  Hemostatic reflex present.  No tenderness to palpation of testicles. Musculoskeletal:     Cervical back: Normal range of motion and neck supple.  Skin:    General: Skin is warm and dry.     Capillary Refill: Capillary refill takes less  than 2 seconds.  Neurological:     Mental Status: He is alert and oriented to person, place, and time.    ED Results / Procedures / Treatments   Labs (all labs ordered are listed, but only abnormal results are displayed) Labs Reviewed  URINALYSIS, ROUTINE W REFLEX MICROSCOPIC - Abnormal; Notable for the following components:      Result Value   Leukocytes,Ua SMALL (*)    All other components within normal limits  URINALYSIS, MICROSCOPIC (REFLEX) - Abnormal; Notable for the following components:   Bacteria, UA RARE (*)    All other components within normal limits  GC/CHLAMYDIA PROBE AMP ([place]) NOT AT [facility]    EKG None  Radiology No results found.  Procedures Procedures    Medications Ordered in ED Medications  cefTRIAXone (ROCEPHIN) injection 500 mg (500 mg Intramuscular Given 07/10/21 1945)    ED Course/ Medical Decision Making/ A&P                           Medical Decision Making Amount and/or Complexity of Data Reviewed Labs: ordered.  Risk Prescription drug management.   31 year old male presents to the ED for evaluation.  Please see HPI for further details.  On examination, the patient is afebrile, nontachycardic but not hypoxic.  Patient lung sounds are clear bilaterally.  Patient abdomen soft and compressible all 4 quadrants.  Patient follows commands appropriately.  Patient worked up utilizing following labs and imaging studies interpreted by me personally: - Urinalysis shows small leukocytes - Gonorrhea/chlamydia testing has not yet resulted  Patient treated with 500 mg ceftriaxone IM shot.  Patient will be sent home on 7 days doxycycline twice daily.  Patient advised to follow-up on the results of his testing utilizing MyChart.  The patient has been advised to complete full course of antibiotics if his test result is positive for gonorrhea or chlamydia.    Patient advised to follow back up with his GI specialist for further management of his  ulcerative colitis.  Patient given return precautions and voiced understanding.  Patient and all his questions answered his satisfaction.  The patient is stable this time for discharge home.  Final Clinical Impression(s) / ED Diagnoses Final diagnoses:  STD exposure  Penile discharge    Rx / DC Orders ED Discharge Orders          Ordered    doxycycline (VIBRAMYCIN) 100 MG capsule  2 times daily        07/10/21 1947              Clent Ridges 07/10/21 1949    Long, Arlyss Repress, MD  07/10/21 2341  

## 2021-07-10 NOTE — ED Triage Notes (Signed)
Pt states exposure to STD from male partner States penile discharge Denies and pain  States mucus in BMs

## 2021-07-11 LAB — GC/CHLAMYDIA PROBE AMP (~~LOC~~) NOT AT ARMC
Chlamydia: NEGATIVE
Comment: NEGATIVE
Comment: NORMAL
Neisseria Gonorrhea: NEGATIVE
